# Patient Record
Sex: Female | Born: 1945 | Race: Black or African American | Hispanic: No | Marital: Married | Smoking: Never smoker
Health system: Southern US, Community
[De-identification: ages and names within clinical notes are randomized; demographics above are authoritative.]

## PROBLEM LIST (undated history)

## (undated) DIAGNOSIS — G5600 Carpal tunnel syndrome, unspecified upper limb: Secondary | ICD-10-CM

## (undated) DIAGNOSIS — I10 Essential (primary) hypertension: Secondary | ICD-10-CM

## (undated) DIAGNOSIS — E669 Obesity, unspecified: Secondary | ICD-10-CM

## (undated) DIAGNOSIS — M1990 Unspecified osteoarthritis, unspecified site: Secondary | ICD-10-CM

## (undated) DIAGNOSIS — K219 Gastro-esophageal reflux disease without esophagitis: Secondary | ICD-10-CM

## (undated) DIAGNOSIS — E049 Nontoxic goiter, unspecified: Secondary | ICD-10-CM

## (undated) HISTORY — DX: Unspecified osteoarthritis, unspecified site: M19.90

## (undated) HISTORY — DX: Essential (primary) hypertension: I10

## (undated) HISTORY — PX: CHOLECYSTECTOMY: SHX55

## (undated) HISTORY — PX: ABDOMINAL HYSTERECTOMY: SHX81

## (undated) HISTORY — PX: TUBAL LIGATION: SHX77

## (undated) HISTORY — DX: Obesity, unspecified: E66.9

## (undated) HISTORY — DX: Carpal tunnel syndrome, unspecified upper limb: G56.00

## (undated) HISTORY — PX: TOTAL HIP ARTHROPLASTY: SHX124

## (undated) HISTORY — DX: Gastro-esophageal reflux disease without esophagitis: K21.9

## (undated) HISTORY — PX: BREAST REDUCTION SURGERY: SHX8

## (undated) HISTORY — DX: Nontoxic goiter, unspecified: E04.9

## (undated) HISTORY — PX: REDUCTION MAMMAPLASTY: SUR839

## (undated) HISTORY — PX: DILATION AND CURETTAGE OF UTERUS: SHX78

## (undated) HISTORY — PX: BREAST SURGERY: SHX581

## (undated) HISTORY — PX: JOINT REPLACEMENT: SHX530

---

## 2002-06-21 ENCOUNTER — Encounter: Admission: RE | Admit: 2002-06-21 | Discharge: 2002-06-21 | Payer: Self-pay | Admitting: Internal Medicine

## 2002-06-21 ENCOUNTER — Encounter: Payer: Self-pay | Admitting: Internal Medicine

## 2003-10-23 ENCOUNTER — Encounter: Admission: RE | Admit: 2003-10-23 | Discharge: 2003-10-23 | Payer: Self-pay | Admitting: Internal Medicine

## 2003-11-24 ENCOUNTER — Encounter: Admission: RE | Admit: 2003-11-24 | Discharge: 2003-11-24 | Payer: Self-pay | Admitting: Orthopedic Surgery

## 2004-06-14 ENCOUNTER — Encounter: Admission: RE | Admit: 2004-06-14 | Discharge: 2004-06-14 | Payer: Self-pay | Admitting: Internal Medicine

## 2004-10-19 ENCOUNTER — Ambulatory Visit: Payer: Self-pay | Admitting: Physical Medicine & Rehabilitation

## 2004-10-19 ENCOUNTER — Inpatient Hospital Stay (HOSPITAL_COMMUNITY): Admission: RE | Admit: 2004-10-19 | Discharge: 2004-10-25 | Payer: Self-pay | Admitting: Orthopedic Surgery

## 2007-01-12 ENCOUNTER — Encounter: Admission: RE | Admit: 2007-01-12 | Discharge: 2007-01-12 | Payer: Self-pay | Admitting: Internal Medicine

## 2008-11-19 ENCOUNTER — Encounter: Admission: RE | Admit: 2008-11-19 | Discharge: 2008-11-19 | Payer: Self-pay | Admitting: Orthopedic Surgery

## 2008-12-05 ENCOUNTER — Ambulatory Visit (HOSPITAL_COMMUNITY): Admission: RE | Admit: 2008-12-05 | Discharge: 2008-12-05 | Payer: Self-pay | Admitting: Orthopedic Surgery

## 2008-12-23 ENCOUNTER — Encounter: Admission: RE | Admit: 2008-12-23 | Discharge: 2008-12-23 | Payer: Self-pay | Admitting: Orthopedic Surgery

## 2009-02-23 ENCOUNTER — Inpatient Hospital Stay (HOSPITAL_COMMUNITY): Admission: RE | Admit: 2009-02-23 | Discharge: 2009-02-27 | Payer: Self-pay | Admitting: Orthopedic Surgery

## 2009-08-07 ENCOUNTER — Encounter: Admission: RE | Admit: 2009-08-07 | Discharge: 2009-08-07 | Payer: Self-pay | Admitting: Internal Medicine

## 2010-04-18 LAB — BASIC METABOLIC PANEL
BUN: 10 mg/dL (ref 6–23)
BUN: 11 mg/dL (ref 6–23)
BUN: 12 mg/dL (ref 6–23)
CO2: 28 mEq/L (ref 19–32)
CO2: 30 mEq/L (ref 19–32)
Calcium: 8.2 mg/dL — ABNORMAL LOW (ref 8.4–10.5)
Calcium: 8.5 mg/dL (ref 8.4–10.5)
Calcium: 9.9 mg/dL (ref 8.4–10.5)
Chloride: 100 mEq/L (ref 96–112)
Chloride: 102 mEq/L (ref 96–112)
Creatinine, Ser: 0.74 mg/dL (ref 0.4–1.2)
Creatinine, Ser: 0.75 mg/dL (ref 0.4–1.2)
Creatinine, Ser: 0.98 mg/dL (ref 0.4–1.2)
GFR calc Af Amer: >60 mL/min (ref >60)
GFR calc Af Amer: >60 mL/min (ref >60)
GFR calc non Af Amer: 58 mL/min — ABNORMAL LOW (ref >60)
GFR calc non Af Amer: >60 mL/min (ref >60)
Glucose, Bld: 116 mg/dL — ABNORMAL HIGH (ref 70–99)
Glucose, Bld: 91 mg/dL (ref 70–99)
Potassium: 3.8 mEq/L (ref 3.5–5.1)
Potassium: 3.8 mEq/L (ref 3.5–5.1)
Sodium: 139 mEq/L (ref 135–145)

## 2010-04-18 LAB — URINALYSIS, ROUTINE W REFLEX MICROSCOPIC
Bilirubin Urine: NEGATIVE
Glucose, UA: NEGATIVE mg/dL
Hgb urine dipstick: NEGATIVE
Ketones, ur: NEGATIVE mg/dL
Nitrite: NEGATIVE
Protein, ur: NEGATIVE mg/dL
Specific Gravity, Urine: 1.022 (ref 1.005–1.030)
Urobilinogen, UA: 1 mg/dL (ref 0.0–1.0)
pH: 6.5 (ref 5.0–8.0)

## 2010-04-18 LAB — URINE MICROSCOPIC-ADD ON

## 2010-04-18 LAB — PROTIME-INR
INR: 1.05 (ref 0.00–1.49)
INR: 1.06 (ref 0.00–1.49)
INR: 1.25 (ref 0.00–1.49)
INR: 1.54 — ABNORMAL HIGH (ref 0.00–1.49)
INR: 1.72 — ABNORMAL HIGH (ref 0.00–1.49)
Prothrombin Time: 13.6 seconds (ref 11.6–15.2)
Prothrombin Time: 13.7 seconds (ref 11.6–15.2)
Prothrombin Time: 15.6 seconds — ABNORMAL HIGH (ref 11.6–15.2)
Prothrombin Time: 18.4 seconds — ABNORMAL HIGH (ref 11.6–15.2)
Prothrombin Time: 20 seconds — ABNORMAL HIGH (ref 11.6–15.2)

## 2010-04-18 LAB — GRAM STAIN

## 2010-04-18 LAB — CBC
HCT: 36.8 % (ref 36.0–46.0)
Hemoglobin: 12.1 g/dL (ref 12.0–15.0)
MCHC: 32.8 g/dL (ref 30.0–36.0)
MCHC: 33.7 g/dL (ref 30.0–36.0)
MCV: 83.1 fL (ref 78.0–100.0)
MCV: 83.2 fL (ref 78.0–100.0)
MCV: 83.4 fL (ref 78.0–100.0)
Platelets: 228 10*3/uL (ref 150–400)
Platelets: 250 10*3/uL (ref 150–400)
Platelets: 261 10*3/uL (ref 150–400)
Platelets: 279 10*3/uL (ref 150–400)
RBC: 3.52 MIL/uL — ABNORMAL LOW (ref 3.87–5.11)
RBC: 3.52 MIL/uL — ABNORMAL LOW (ref 3.87–5.11)
RBC: 4.42 MIL/uL (ref 3.87–5.11)
RDW: 15.3 % (ref 11.5–15.5)
RDW: 15.3 % (ref 11.5–15.5)
WBC: 6.4 10*3/uL (ref 4.0–10.5)
WBC: 9.1 10*3/uL (ref 4.0–10.5)
WBC: 9.6 10*3/uL (ref 4.0–10.5)

## 2010-04-18 LAB — TYPE AND SCREEN
ABO/RH(D): AB POS
Antibody Screen: NEGATIVE

## 2010-04-18 LAB — URINE CULTURE: Colony Count: 50000

## 2010-04-18 LAB — BODY FLUID CULTURE: Culture: NO GROWTH

## 2010-04-18 LAB — TISSUE CULTURE

## 2010-04-18 LAB — APTT: aPTT: 28 seconds (ref 24–37)

## 2010-04-18 LAB — ANAEROBIC CULTURE

## 2010-06-18 NOTE — Discharge Summary (Signed)
NAME:  [name], [name] NO.:  [id number]   MEDICAL RECORD NO.:  [id number]          PATIENT TYPE:  INP   LOCATION:  5025                         FACILITY:  [facility]   PHYSICIAN:  Burnard Bunting, M.D.    DATE OF BIRTH:  1945/12/01   DATE OF ADMISSION:  10/19/2004  DATE OF DISCHARGE:  10/25/2004                                 DISCHARGE SUMMARY   DISCHARGE DIAGNOSIS:  Right hip arthritis.   SECONDARY DIAGNOSIS:  None.   OPERATION/PROCEDURE:  Right total hip arthroplasty.   HOSPITAL COURSE:  Diana Thornton is a 65 year old patient with right  hip arthritis. She underwent right total hip arthroplasty on October 20, 2004. She tolerated the procedure well without any complications. She was  started on Coumadin postoperatively for DVT prophylaxis. Leg lengths were  approximately equal and dorsiflexion was intact. On postoperative day #1,  hemoglobin was 10.5 at that time. She was immobilized on physical therapy,  weight bearing as tolerated. She had an otherwise unremarkable recovery. She  was mobilizing well in the hallway at that time of discharge. She was  instructed in hip precautions. She was discharged in good condition.   DISCHARGE MEDICATIONS:  Lotrel, potassium, hydrochlorothiazide, fluoxetine,  Premarin, AcipHex, Altace, as well as Coumadin and Percocet for deep venous  thrombosis prophylaxis and Robaxin. She will follow up with me in one week  for suture removal.           ______________________________  G. Dorene Grebe, M.D.     GSD/MEDQ  D:  01/06/2005  T:  01/06/2005  Job:  [id number]

## 2010-06-18 NOTE — Op Note (Signed)
NAME:  Diana Thornton, Diana Thornton NO.:  [id number]   MEDICAL RECORD NO.:  [id number]          PATIENT TYPE:  INP   LOCATION:  5025                         FACILITY:  [facility]   PHYSICIAN:  Burnard Bunting, M.D.    DATE OF BIRTH:  [birth year]   DATE OF PROCEDURE:  10/19/2004  DATE OF DISCHARGE:                                 OPERATIVE REPORT   PREOPERATIVE DIAGNOSIS:  Right hip arthritis.   POSTOPERATIVE DIAGNOSIS:  Right hip arthritis.   PROCEDURE:  Right total hip replacement.   SURGEON:  Burnard Bunting, M.D.   ASSISTANT:   ANESTHESIA:  General endotracheal anesthesia.   ESTIMATED BLOOD LOSS:  400 mL.   DRAINS:  None.   COMPONENTS:  S-ROM ASR cup 48, femoral stem 20 by 15 38 standard neck plus a  lateral proximal sleeve 20 B small sleeve adapter plus 0, head size 43.   PROCEDURE IN DETAIL:  The patient was brought to the operating room where  general endotracheal anesthesia was induced.  Preoperative IV antibiotics  including Ancef and gentamicin were administered.  The patient was placed in  the lateral decubitus position with the left axillary nerve and left  peroneal nerve well padded.  The right foot, leg, and hip were prepped with  DuraPrep solution and draped in a sterile manner.  Diana Thornton was used to cover  the operative field.  A posterior approach to the hip was utilized.  The  skin and subcutaneous tissue were sharply divided.  The fascia lata was  encountered and divided.  The sciatic nerve was identified at this time and  protected during the remaining portion of the case.  A hip retractor was  placed.  The piriformis tendon was identified, tagged, and the external  rotators were detached from the capsule.  Bleed points encountered were  controlled using electrocautery.  The capsule was split in a T-shaped manner  with #1 Ethibond marking the edges.  The labrum was excised.  A trial  component was constructed on the back table.  This trial component was used  to construct the femoral neck cut.  The femoral neck was then cut  approximately a fingerbreadth above the lesser trochanter.  Distal reaming  was then performed after lateralization.  Reaming up to 15.5 mm was  performed with good bony contact.  At this time, proximal reaming and  milling was performed.  The sleeve and trial implant were then placed.  At  this time, following femoral preparation, the acetabulum was reamed at  approximately 45 degrees of abduction and 45 degrees of anteversion.  The  cup was reamed to 48 mm and excess removed.  The 28 mm cup was then tapped  into position and well seated.  At this time, a trial reduction was  performed with components seated in approximately 10 degrees of retroversion  based on the calcar, with a plus 0 neck, the hip had a good range of motion  and stability to external rotation and extension, position of the sleeve, as  well as 90 degrees hip flexion, 15 degrees of abduction, and 70 degrees of  internal rotation.  At this time, interoperative x-ray demonstrated  approximate equal leg lengths and good fill of the canal and position of the  components.  At this time, the femoral components were removed and the true  sleeve was placed.  The component was then placed in 10 degrees of  anteversion relative to the sleeve and good fit was obtained.  The hip was  again reduced with a +0 sleeve and found to have good stability in extension  and position of the sleeve and hip placed with internal rotation.  At this  time, the incision was thoroughly irrigated.  The sciatic nerve was again  palpated and found to be intact.  The capsule was repaired using #1 Vicryl  suture.  The piriformis tendon was tagged back to the capsule using #1  Vicryl suture.  The fascia lata was closed using interrupted inverted #1  Vicryl suture, the subcutaneous tissues were closed in layers using 0 Vicryl  and 2-0 Vicryl followed by skin staples.  The patient tolerated  the  procedure well without immediate complications.  Leg lengths were  approximately equal at the conclusion of the case.  A knee immobilizer was  placed.           ______________________________  Diana Thornton, M.D.     GSD/MEDQ  D:  10/19/2004  T:  10/19/2004  Job:  [id number]

## 2010-11-12 ENCOUNTER — Emergency Department (HOSPITAL_COMMUNITY): Payer: 59

## 2010-11-12 ENCOUNTER — Emergency Department (HOSPITAL_COMMUNITY)
Admission: EM | Admit: 2010-11-12 | Discharge: 2010-11-13 | Disposition: A | Discharge status: Home / Self Care | Payer: 59 | Attending: Emergency Medicine | Admitting: Emergency Medicine

## 2010-11-12 DIAGNOSIS — M129 Arthropathy, unspecified: Secondary | ICD-10-CM | POA: Insufficient documentation

## 2010-11-12 DIAGNOSIS — I10 Essential (primary) hypertension: Secondary | ICD-10-CM | POA: Insufficient documentation

## 2010-11-12 DIAGNOSIS — R079 Chest pain, unspecified: Secondary | ICD-10-CM | POA: Insufficient documentation

## 2010-11-12 LAB — BASIC METABOLIC PANEL
BUN: 19 mg/dL (ref 6–23)
Calcium: 10 mg/dL (ref 8.4–10.5)
GFR calc Af Amer: >90 mL/min (ref >90)
GFR calc non Af Amer: >90 mL/min (ref >90)
Glucose, Bld: 114 mg/dL — ABNORMAL HIGH (ref 70–99)
Sodium: 139 mEq/L (ref 135–145)

## 2010-11-12 LAB — CBC
HCT: 39.2 % (ref 36.0–46.0)
Hemoglobin: 12.5 g/dL (ref 12.0–15.0)
MCH: 26.6 pg (ref 26.0–34.0)
MCHC: 31.9 g/dL (ref 30.0–36.0)
RDW: 14.3 % (ref 11.5–15.5)

## 2010-11-12 LAB — POCT I-STAT TROPONIN I: Troponin i, poc: 0 ng/mL (ref 0.00–0.08)

## 2010-11-12 LAB — DIFFERENTIAL
Basophils Relative: 0 % (ref 0–1)
Eosinophils Relative: 2 % (ref 0–5)
Monocytes Absolute: 0.5 10*3/uL (ref 0.1–1.0)
Monocytes Relative: 7 % (ref 3–12)
Neutro Abs: 2.7 10*3/uL (ref 1.7–7.7)

## 2010-11-15 ENCOUNTER — Emergency Department (HOSPITAL_COMMUNITY)
Admission: EM | Admit: 2010-11-15 | Discharge: 2010-11-15 | Disposition: A | Discharge status: Home / Self Care | Payer: 59 | Attending: Emergency Medicine | Admitting: Emergency Medicine

## 2010-11-15 DIAGNOSIS — I10 Essential (primary) hypertension: Secondary | ICD-10-CM | POA: Insufficient documentation

## 2010-11-15 DIAGNOSIS — Z0000 Encounter for general adult medical examination without abnormal findings: Secondary | ICD-10-CM | POA: Insufficient documentation

## 2010-11-15 DIAGNOSIS — Z79899 Other long term (current) drug therapy: Secondary | ICD-10-CM | POA: Insufficient documentation

## 2010-11-17 ENCOUNTER — Encounter: Payer: Self-pay | Admitting: *Deleted

## 2010-11-17 ENCOUNTER — Encounter: Payer: Self-pay | Admitting: Cardiovascular Disease

## 2010-11-18 ENCOUNTER — Encounter: Payer: Self-pay | Admitting: Cardiovascular Disease

## 2010-11-18 ENCOUNTER — Ambulatory Visit (INDEPENDENT_AMBULATORY_CARE_PROVIDER_SITE_OTHER): Payer: 59 | Admitting: Cardiovascular Disease

## 2010-11-18 DIAGNOSIS — I517 Cardiomegaly: Secondary | ICD-10-CM

## 2010-11-18 DIAGNOSIS — I10 Essential (primary) hypertension: Secondary | ICD-10-CM | POA: Insufficient documentation

## 2010-11-18 DIAGNOSIS — R0602 Shortness of breath: Secondary | ICD-10-CM

## 2010-11-18 DIAGNOSIS — R079 Chest pain, unspecified: Secondary | ICD-10-CM | POA: Insufficient documentation

## 2010-11-18 NOTE — Assessment & Plan Note (Signed)
ETT and CT to R/O PE and get better look at aorta.

## 2010-11-18 NOTE — Progress Notes (Signed)
65 yo seen in urgent care 10/12 and 10/15 for SSCP.  Pain two weeks ago and then recured.  R/O ECG normal reviewed both ER visit notes.  Pain is sharp and radiates to back  Significant dyspnea with it.  CXR ? Cardiomegaly.  BP meds for years.  Did not take them today and may not be ideally controlled.  She works full time at [employer] in [place] and has no problems.  Activity limited by right hip has been replaced twice.   ROS: Denies fever, malais, weight loss, blurry vision, decreased visual acuity, cough, sputum, SOB, hemoptysis, pleuritic pain, palpitaitons, heartburn, abdominal pain, melena, lower extremity edema, claudication, or rash.  All other systems reviewed and negative   General: Affect appropriate Healthy:  appears stated age HEENT: normal Neck supple with no adenopathy JVP normal no bruits no thyromegaly Lungs clear with no wheezing and good diaphragmatic motion Heart:  S1/S2 no murmur,rub, gallop or click PMI normal Abdomen: benighn, BS positve, no tenderness, no AAA no bruit.  No HSM or HJR Distal pulses intact with no bruits No edema Neuro non-focal Skin warm and dry No muscular weakness  Medications Current Outpatient Prescriptions  Medication Sig Dispense Refill  . hydrochlorothiazide (HYDRODIURIL) 50 MG tablet Take 50 mg by mouth daily.        . meloxicam (MOBIC) 15 MG tablet Take 15 mg by mouth as needed.       Marland Kitchen olmesartan (BENICAR) 40 MG tablet Take 40 mg by mouth daily.        . verapamil (CALAN-SR) 180 MG CR tablet Take 180 mg by mouth at bedtime.          Allergies Morphine and related  Family History: No family history on file.  Social History: History   Social History  . Marital Status: Married    Spouse Name: N/A    Number of Children: N/A  . Years of Education: N/A   Occupational History  . Not on file.   Social History Main Topics  . Smoking status: Never Smoker   . Smokeless tobacco: Not on file  . Alcohol Use: Not on file  . Drug Use:  Not on file  . Sexually Active: Not on file   Other Topics Concern  . Not on file   Social History Narrative  . No narrative on file    Electrocardiogram:  10//12 10/15 ER reviewed NSR Normal ECG  Assessment and Plan

## 2010-11-18 NOTE — Assessment & Plan Note (Signed)
Continue current meds with compliance stressed.  See what exercise BP is

## 2010-11-18 NOTE — Patient Instructions (Addendum)
Your physician recommends that you schedule a follow-up appointment in: AS NEEDED Your physician has requested that you have an echocardiogram. Echocardiography is a painless test that uses sound waves to create images of your heart. It provides your doctor with information about the size and shape of your heart and how well your heart's chambers and valves are working. This procedure takes approximately one hour. There are no restrictions for this procedure. DX SHORTNESS OF BREATH Your physician has requested that you have an exercise tolerance test. For further information please visit [email or web address] Please also follow instruction sheet, as given. DX CHEST PAIN Non-Cardiac CT scanning, (CAT scanning), is a noninvasive, special x-ray that produces cross-sectional images of the body using x-rays and a computer. CT scans help physicians diagnose and treat medical conditions. For some CT exams, a contrast material is used to enhance visibility in the area of the body being studied. CT scans provide greater clarity and reveal more details than regular x-ray exams. R/O PE

## 2010-11-18 NOTE — Assessment & Plan Note (Signed)
With dyspnea  Echo to assess RV/LV function

## 2010-11-19 ENCOUNTER — Ambulatory Visit (INDEPENDENT_AMBULATORY_CARE_PROVIDER_SITE_OTHER)
Admission: RE | Admit: 2010-11-19 | Discharge: 2010-11-19 | Disposition: A | Discharge status: Home / Self Care | Payer: 59 | Source: Ambulatory Visit | Attending: Cardiovascular Disease | Admitting: Cardiovascular Disease

## 2010-11-19 DIAGNOSIS — R079 Chest pain, unspecified: Secondary | ICD-10-CM

## 2010-11-19 DIAGNOSIS — R0602 Shortness of breath: Secondary | ICD-10-CM

## 2010-11-19 MED ORDER — IOHEXOL 350 MG/ML SOLN: 100.0000 mL | Freq: Once | INTRAVENOUS | Status: AC | PRN

## 2010-11-25 ENCOUNTER — Encounter: Payer: Self-pay | Admitting: *Deleted

## 2010-12-02 ENCOUNTER — Ambulatory Visit (HOSPITAL_COMMUNITY): Payer: 59 | Attending: Cardiology | Admitting: Radiology

## 2010-12-02 ENCOUNTER — Encounter (INDEPENDENT_AMBULATORY_CARE_PROVIDER_SITE_OTHER): Payer: 59

## 2010-12-02 ENCOUNTER — Ambulatory Visit (INDEPENDENT_AMBULATORY_CARE_PROVIDER_SITE_OTHER): Payer: 59 | Admitting: Physician Assistant

## 2010-12-02 DIAGNOSIS — R079 Chest pain, unspecified: Secondary | ICD-10-CM

## 2010-12-02 DIAGNOSIS — R9431 Abnormal electrocardiogram [ECG] [EKG]: Secondary | ICD-10-CM

## 2010-12-02 DIAGNOSIS — I10 Essential (primary) hypertension: Secondary | ICD-10-CM | POA: Insufficient documentation

## 2010-12-02 DIAGNOSIS — I517 Cardiomegaly: Secondary | ICD-10-CM

## 2010-12-02 DIAGNOSIS — R0989 Other specified symptoms and signs involving the circulatory and respiratory systems: Secondary | ICD-10-CM | POA: Insufficient documentation

## 2010-12-02 DIAGNOSIS — I079 Rheumatic tricuspid valve disease, unspecified: Secondary | ICD-10-CM | POA: Insufficient documentation

## 2010-12-02 DIAGNOSIS — E669 Obesity, unspecified: Secondary | ICD-10-CM | POA: Insufficient documentation

## 2010-12-02 DIAGNOSIS — R9439 Abnormal result of other cardiovascular function study: Secondary | ICD-10-CM

## 2010-12-02 DIAGNOSIS — R0609 Other forms of dyspnea: Secondary | ICD-10-CM | POA: Insufficient documentation

## 2010-12-02 NOTE — Patient Instructions (Signed)
Your physician recommends that you schedule a follow-up appointment in: 2-3 WEEKS WITH DR. Eden Emms PER Tereso Newcomer, PA-C  Your physician has requested that you have a lexiscan myoview DX ABNORMAL STRESS TEST, CHEST PAIN. For further information please visit [email or web address] Please follow instruction sheet, as given.   Your physician has recommended that you wear a 48 HOUR holter monitor DX ABNORMAL STRESS TEST, CHEST PAIN. Holter monitors are medical devices that record the heart's electrical activity. Doctors most often use these monitors to diagnose arrhythmias. Arrhythmias are problems with the speed or rhythm of the heartbeat. The monitor is a small, portable device. You can wear one while you do your normal daily activities. This is usually used to diagnose what is causing palpitations/syncope (passing out).

## 2010-12-02 NOTE — Progress Notes (Signed)
Exercise Treadmill Test  Pre-Exercise Testing Evaluation Rhythm: normal sinus  Rate: 66   PR:  .18 QRS:  .09  QT:  .39 QTc: .41     Test  Exercise Tolerance Test Ordering MD: Charlton Haws, MD  Interpreting MD:  Tereso Newcomer PA-C  Unique Test No:1    Treadmill:  1  Indication for ETT: chest pain - rule out ischemia  Contraindication to ETT: No   Stress Modality: exercise - treadmill  Cardiac Imaging Performed: non   Protocol: standard Bruce - maximal  Max BP: 230/70  Max MPHR (bpm):  157 85% MPR (bpm):  133  MPHR obtained (bpm): 134 % MPHR obtained:  85%  Reached 85% MPHR (min:sec):  3:00 Total Exercise Time (min-sec):  3:14  Workload in METS: 4.8 Borg Scale: 15  Reason ETT Terminated:  patient's desire to stop    ST Segment Analysis At Rest: normal ST segments - no evidence of significant ST depression With Exercise: significant ischemic ST depression  Other Information Arrhythmia:  Frequent PACs, rare PVC Angina during ETT:  absent (0) Quality of ETT:  diagnostic  ETT Interpretation:  abnormal - evidence of ST depression consistent with ischemia  Comments: Poor exercise tolerance. No chest pain. Patient was dyspneic.  Hypertensive BP response to exercise. There was inferolateral ST segment depression on stress ECGs.  Some segments were downsloping.   Recommendations: Discussed with Dr. Eden Emms who reviewed ECGs.  Will arrange Lexiscan Myoview to further assess for ischemia. Patient also noting lots of palpitations.  She had a lot of PACs during her test. Will arrange 48 Holter and follow up with Dr. Eden Emms. Tereso Newcomer, PA-C

## 2010-12-08 ENCOUNTER — Ambulatory Visit (HOSPITAL_COMMUNITY): Payer: 59 | Attending: Cardiovascular Disease | Admitting: Radiology

## 2010-12-08 DIAGNOSIS — R0602 Shortness of breath: Secondary | ICD-10-CM

## 2010-12-08 DIAGNOSIS — R079 Chest pain, unspecified: Secondary | ICD-10-CM | POA: Insufficient documentation

## 2010-12-08 DIAGNOSIS — R9431 Abnormal electrocardiogram [ECG] [EKG]: Secondary | ICD-10-CM | POA: Insufficient documentation

## 2010-12-08 DIAGNOSIS — R9439 Abnormal result of other cardiovascular function study: Secondary | ICD-10-CM

## 2010-12-08 DIAGNOSIS — I10 Essential (primary) hypertension: Secondary | ICD-10-CM | POA: Insufficient documentation

## 2010-12-08 MED ORDER — TECHNETIUM TC 99M TETROFOSMIN IV KIT
11.0000 | PACK | Freq: Once | INTRAVENOUS | Status: AC | PRN
  Administered 2010-12-08: 11 via INTRAVENOUS

## 2010-12-08 MED ORDER — TECHNETIUM TC 99M TETROFOSMIN IV KIT
33.0000 | PACK | Freq: Once | INTRAVENOUS | Status: AC | PRN
  Administered 2010-12-08: 33 via INTRAVENOUS

## 2010-12-08 MED ORDER — REGADENOSON 0.4 MG/5ML IV SOLN
0.4000 mg | Freq: Once | INTRAVENOUS | Status: AC
  Administered 2010-12-08: 0.4 mg via INTRAVENOUS

## 2010-12-08 NOTE — Progress Notes (Signed)
[facility] SITE 3 NUCLEAR MED [address] [place] [address] [phone]  Cardiology Nuclear Med Study  Diana Thornton is a 65 y.o. female [id number] March 23, 1945   Nuclear Med Background Indication for Stress Test:  Evaluation for Ischemia,11/12/10 Post Hospital: Chest pain, (-) enzymes, Abnormal EKG and  12/02/10 Abnormal GXT: ST changes (inferolateral) History: 11/18/10 (CT)/MRI: Goiter/mild heart enlargement, 11/01.12 Echo: EF 65-70% and 12/02/10 GXT: ST depression with ischemia Cardiac Risk Factors: Hypertension  Symptoms:  Chest Pain   Nuclear Pre-Procedure Caffeine/Decaff Intake:  None NPO After: 5:30pm   Lungs:  clear IV 0.9% NS with Angio Cath:  20g  IV Site: L Antecubital x 1, tolerated well IV Started by:  Irean Hong, RN  Chest Size (in):  40 Cup Size: D  Height: 5\' 2"  (1.575 m)  Weight:  208 lb (94.348 kg)  BMI:  Body mass index is 38.04 kg/(m^2). Tech Comments:  N/A    Nuclear Med Study 1 or 2 day study: 1 day  Stress Test Type:  Lexiscan  Reading MD: Charlton Haws, MD  Order Authorizing Provider:  Charlton Haws, MD,Scott Alben Spittle, [facility]  Resting Radionuclide: Technetium 64m Tetrofosmin  Resting Radionuclide Dose: 11.0 mCi   Stress Radionuclide:  Technetium 74m Tetrofosmin  Stress Radionuclide Dose: 33.0 mCi           Stress Protocol Rest HR: 58 Stress HR: 111  Rest BP: 119/66 Stress BP: 156/53  Exercise Time (min): n/a METS: n/a   Predicted Max HR: 157 bpm % Max HR: 70.7 bpm Rate Pressure Product: 21308   Dose of Adenosine (mg):  n/a Dose of Lexiscan: 0.4 mg  Dose of Atropine (mg): n/a Dose of Dobutamine: n/a mcg/kg/min (at max HR)  Stress Test Technologist: Milana Na, EMT-P  Nuclear Technologist:  Doyne Keel, CNMT     Rest Procedure:  Myocardial perfusion imaging was performed at rest 45 minutes following the intravenous administration of Technetium 47m Tetrofosmin. Rest ECG: NSR  Stress Procedure:  The patient  received IV Lexiscan 0.4 mg over 15-seconds.  Technetium 7m Tetrofosmin injected at 30-seconds.  There were non specific changes, sob, woozy, dizziness, abdominal pain, and a rare pac with Lexiscan.  Quantitative spect images were obtained after a 45 minute delay. Stress ECG: No significant change from baseline ECG  QPS Raw Data Images:  Normal; no motion artifact; normal heart/lung ratio. Stress Images:  Normal homogeneous uptake in all areas of the myocardium. Rest Images:  Normal homogeneous uptake in all areas of the myocardium. Subtraction (SDS):  Normal Transient Ischemic Dilatation (Normal <1.22):  0.99 Lung/Heart Ratio (Normal <0.45):  0.27  Quantitative Gated Spect Images QGS EDV:  78 ml QGS ESV:  21 ml QGS cine images:  NL LV Function; NL Wall Motion QGS EF: 74%  Impression Exercise Capacity:  Lexiscan with no exercise. BP Response:  Normal blood pressure response. Clinical Symptoms:  No chest pain. ECG Impression:  No significant ST segment change suggestive of ischemia. Comparison with Prior Nuclear Study: No images to compare  Overall Impression:  Normal stress nuclear study.   Charlton Haws

## 2010-12-10 ENCOUNTER — Telehealth: Payer: Self-pay | Admitting: *Deleted

## 2010-12-10 NOTE — Telephone Encounter (Signed)
Spoke with pt, made aware monitor reviewed by dr Eden Emms shows sinus, PAC's with short bursts of SVT (4 beats) pt has an appt 12-24-10 she will make sure to keep that appt Diana Thornton

## 2010-12-17 ENCOUNTER — Telehealth: Payer: Self-pay | Admitting: Cardiovascular Disease

## 2010-12-17 MED ORDER — NEBIVOLOL HCL 10 MG PO TABS: 10.0000 mg | ORAL_TABLET | Freq: Every day | ORAL | Status: DC

## 2010-12-17 NOTE — Telephone Encounter (Signed)
PT AWARE OF MONITOR RESULTS  NEW MED SENT TO [facility]IA [facility] .Zack Seal

## 2010-12-17 NOTE — Telephone Encounter (Signed)
Pl ease call patient at [phone] and leave a message.

## 2010-12-24 ENCOUNTER — Ambulatory Visit: Payer: 59 | Admitting: Cardiovascular Disease

## 2011-01-20 ENCOUNTER — Ambulatory Visit (INDEPENDENT_AMBULATORY_CARE_PROVIDER_SITE_OTHER): Payer: 59 | Admitting: Cardiovascular Disease

## 2011-01-20 ENCOUNTER — Encounter: Payer: Self-pay | Admitting: *Deleted

## 2011-01-20 VITALS — BP 133/75 | HR 86 | Wt 212.0 lb

## 2011-01-20 DIAGNOSIS — R002 Palpitations: Secondary | ICD-10-CM

## 2011-01-20 DIAGNOSIS — I517 Cardiomegaly: Secondary | ICD-10-CM

## 2011-01-20 MED ORDER — ATENOLOL 50 MG PO TABS: 50.0000 mg | ORAL_TABLET | Freq: Every day | ORAL | Status: DC

## 2011-01-20 NOTE — Patient Instructions (Signed)
Your physician recommends that you schedule a follow-up appointment in: 3 months with Dr Eden Emms  Your physician has recommended you make the following change in your medication: stop BYSTOLIC  START ATENOLOL 50 MG 1 TAB  EVERY DAY  Your physician recommends that you return for lab work in: LAB TODAY  TSH FREE T4 DX 785.1

## 2011-01-20 NOTE — Assessment & Plan Note (Signed)
Benign.  Nothing serious on event monitor  Adjust beta blocker F/U in 3 months.  Check TSH/Free T4

## 2011-01-20 NOTE — Progress Notes (Signed)
65 yo seen in urgent care 10/12 and 10/15 for SSCP. Pain two weeks ago and then recured. R/O ECG normal reviewed both ER visit notes. Pain is sharp and radiates to back Significant dyspnea with it. CXR ? Cardiomegaly. BP meds for years. Did not take them today and may not be ideally controlled. She works full time at [employer] in [place] and has no problems. Activity limited by right hip has been replaced twice.  Now has complaints of jitteryness, voice changes.  Bystolic seems to excite her.    Reviewed monitor and only PAC;s 4 bt run SVT.   Reviewed nuclear 11/12 normal with no ischemia Reviewed echo 11/1  EF 65-70% no valve abnormalities  ROS: Denies fever, malais, weight loss, blurry vision, decreased visual acuity, cough, sputum, SOB, hemoptysis, pleuritic pain, palpitaitons, heartburn, abdominal pain, melena, lower extremity edema, claudication, or rash.  All other systems reviewed and negative  General: Affect appropriate Healthy:  appears stated age HEENT: normal Neck supple with no adenopathy JVP normal no bruits no thyromegaly Lungs clear with no wheezing and good diaphragmatic motion Heart:  S1/S2 soft SEM murmur,rub, gallop or click PMI normal Abdomen: benighn, BS positve, no tenderness, no AAA no bruit.  No HSM or HJR Distal pulses intact with no bruits No edema Neuro non-focal Skin warm and dry No muscular weakness   Current Outpatient Prescriptions  Medication Sig Dispense Refill  . hydrochlorothiazide (HYDRODIURIL) 50 MG tablet Take 50 mg by mouth daily.        . meloxicam (MOBIC) 15 MG tablet Take 15 mg by mouth as needed.       . nebivolol (BYSTOLIC) 10 MG tablet Take 10 mg by mouth as needed.        Marland Kitchen olmesartan (BENICAR) 40 MG tablet Take 40 mg by mouth daily.          Allergies  Morphine and related  Electrocardiogram:  Assessment and Plan

## 2011-01-20 NOTE — Assessment & Plan Note (Signed)
Atyhpical  Normal myovue observe

## 2011-01-20 NOTE — Progress Notes (Signed)
Addended by: Alma Friendly on: 01/20/2011 09:25 AM   Modules accepted: Orders

## 2011-01-20 NOTE — Assessment & Plan Note (Signed)
Normal LV size on echo and normal EF by echo and nuclear.  Rx BP

## 2011-01-20 NOTE — Assessment & Plan Note (Signed)
Well controlled.  Continue current medications and low sodium Dash type diet.   Change beta blocker to atenolol

## 2011-04-22 ENCOUNTER — Ambulatory Visit: Payer: 59 | Admitting: Cardiovascular Disease

## 2011-09-07 ENCOUNTER — Emergency Department (HOSPITAL_COMMUNITY)
Admission: EM | Admit: 2011-09-07 | Discharge: 2011-09-07 | Disposition: A | Discharge status: Home / Self Care | Payer: 59 | Attending: Emergency Medicine | Admitting: Emergency Medicine

## 2011-09-07 ENCOUNTER — Emergency Department (HOSPITAL_COMMUNITY): Payer: 59

## 2011-09-07 ENCOUNTER — Ambulatory Visit (INDEPENDENT_AMBULATORY_CARE_PROVIDER_SITE_OTHER): Payer: 59 | Admitting: Family Medicine

## 2011-09-07 ENCOUNTER — Encounter (HOSPITAL_COMMUNITY): Payer: Self-pay | Admitting: Emergency Medicine

## 2011-09-07 VITALS — BP 147/84 | HR 79 | Temp 97.9°F | Resp 18 | Ht 62.5 in | Wt 221.0 lb

## 2011-09-07 DIAGNOSIS — R079 Chest pain, unspecified: Secondary | ICD-10-CM | POA: Insufficient documentation

## 2011-09-07 DIAGNOSIS — I10 Essential (primary) hypertension: Secondary | ICD-10-CM | POA: Insufficient documentation

## 2011-09-07 DIAGNOSIS — K219 Gastro-esophageal reflux disease without esophagitis: Secondary | ICD-10-CM | POA: Insufficient documentation

## 2011-09-07 DIAGNOSIS — E669 Obesity, unspecified: Secondary | ICD-10-CM | POA: Insufficient documentation

## 2011-09-07 LAB — COMPREHENSIVE METABOLIC PANEL
AST: 24 U/L (ref 0–37)
Albumin: 3.9 g/dL (ref 3.5–5.2)
Alkaline Phosphatase: 95 U/L (ref 39–117)
BUN: 14 mg/dL (ref 6–23)
Chloride: 103 mEq/L (ref 96–112)
Creatinine, Ser: 0.67 mg/dL (ref 0.50–1.10)
Potassium: 3.5 mEq/L (ref 3.5–5.1)
Total Bilirubin: 0.5 mg/dL (ref 0.3–1.2)
Total Protein: 7.5 g/dL (ref 6.0–8.3)

## 2011-09-07 LAB — CBC WITH DIFFERENTIAL/PLATELET
Eosinophils Absolute: 0.1 10*3/uL (ref 0.0–0.7)
Eosinophils Relative: 3 % (ref 0–5)
HCT: 39.8 % (ref 36.0–46.0)
Hemoglobin: 12.7 g/dL (ref 12.0–15.0)
Lymphs Abs: 2 10*3/uL (ref 0.7–4.0)
MCH: 26.3 pg (ref 26.0–34.0)
MCV: 82.4 fL (ref 78.0–100.0)
Monocytes Absolute: 0.3 10*3/uL (ref 0.1–1.0)
Monocytes Relative: 6 % (ref 3–12)
RBC: 4.83 MIL/uL (ref 3.87–5.11)

## 2011-09-07 LAB — TROPONIN I: Troponin I: <0.3 ng/mL (ref <0.30)

## 2011-09-07 MED ORDER — ASPIRIN 81 MG PO CHEW: 81.0000 mg | CHEWABLE_TABLET | Freq: Once | ORAL | Status: AC

## 2011-09-07 MED ORDER — OXYCODONE-ACETAMINOPHEN 5-325 MG PO TABS: 1.0000 | ORAL_TABLET | ORAL | Status: AC | PRN

## 2011-09-07 MED ORDER — KETOROLAC TROMETHAMINE 30 MG/ML IJ SOLN
30.0000 mg | Freq: Once | INTRAMUSCULAR | Status: AC
  Administered 2011-09-07: 30 mg via INTRAVENOUS
  Filled 2011-09-07: qty 1

## 2011-09-07 MED ORDER — FENTANYL CITRATE 0.05 MG/ML IJ SOLN
100.0000 ug | Freq: Once | INTRAMUSCULAR | Status: AC
  Administered 2011-09-07: 100 ug via INTRAVENOUS
  Filled 2011-09-07: qty 2

## 2011-09-07 MED ORDER — IBUPROFEN 600 MG PO TABS: 600.0000 mg | ORAL_TABLET | Freq: Three times a day (TID) | ORAL | Status: AC | PRN

## 2011-09-07 NOTE — ED Notes (Signed)
Husband at bedside.  

## 2011-09-07 NOTE — ED Notes (Signed)
Onset 2 days ago chest pain and back pain continued today seen at urgent care sent to ED for evaluation.  Urgent care gave 324mg  aspirin and started IV left AC 20g. EMS gave 2 nitrol sl pain constant 8/10 pressure.  Ax4.

## 2011-09-07 NOTE — Progress Notes (Signed)
  Subjective:    Patient ID: Diana Thornton, female    DOB: 1945-10-16, 66 y.o.   MRN: [id number]  HPI 66 year old female presents with 3 days of progressively worsening chest pain. States it started Monday morning while at work. 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 The initial pain was sharp and on the left side of her chest. Since then it has worsened to an 8/10 and radiates to her left shoulder blade and down her left arm.  It is a pressure but is severe enough that she did not get any sleep last night.  Denies SOB, headache, vision changes, nausea, vomiting, or abdominal pain.  She has taken Aleve which helps some. Does admit that the pain is worse on exertion.    She has hypertension which is stable.  No history of diabetes.      Review of Systems  All other systems reviewed and are negative.       Objective:   Physical Exam  Constitutional: She is oriented to person, place, and time. She appears well-developed and well-nourished.  HENT:  Head: Normocephalic and atraumatic.  Right Ear: External ear normal.  Left Ear: External ear normal.  Eyes: Conjunctivae are normal.  Neck: Normal range of motion.  Cardiovascular: Normal rate, regular rhythm and normal heart sounds.     Pulmonary/Chest: Effort normal and breath sounds normal.  Musculoskeletal:       Left shoulder: Pain: pain with ROM.  Neurological: She is alert and oriented to person, place, and time.  Psychiatric: She has a normal mood and affect. Her behavior is normal. Judgment and thought content normal.     Seen and discussed with Dr. Patsy Lager.     Assessment & Plan:   1. Chest pain  EKG 12-Lead  Pain likely to be musculoskeletal but will send to ED to rule out cardiac source.  Patient sent to ED via Ambulance for further evaluation and treatment.

## 2011-09-07 NOTE — ED Provider Notes (Signed)
History     CSN: [id number]  Arrival date & time 09/07/11  1047   First MD Initiated Contact with Patient 09/07/11 1052      Chief Complaint  Patient presents with  . Chest Pain    HPI The patient reports she developed left-sided chest discomfort began 2 days ago and has been constant since then.  Is not pleuritic.  It is worse with palpation movement.  She feels under her left breast.  She's noticed no new rash.  She has no new shortness of breath.  She has a history of hypertension and obesity but no history of coronary artery disease.  She does have a history of herpes zoster but is seen no outbreak as of recently.  She reports her pain is constant and moderate in severity.  She denies unilateral leg swelling.  She has no history of DVT or pulmonary embolism   Past Medical History  Diagnosis Date  . HTN (hypertension)   . GERD (gastroesophageal reflux disease)   . Osteoarthritis   . Carpal tunnel syndrome   . Vitamin d deficiency   . Obesity   . Herpes zoster 2003  . Goiter     Past Surgical History  Procedure Date  . Total hip arthroplasty   . Breast reduction surgery   . Cholecystectomy     No family history on file.  History  Substance Use Topics  . Smoking status: Never Smoker   . Smokeless tobacco: Not on file  . Alcohol Use: No    OB History    Grav Para Term Preterm Abortions TAB SAB Ect Mult Living                  Review of Systems  All other systems reviewed and are negative.    Allergies  Morphine and related  Home Medications   Current Outpatient Rx  Name Route Sig Dispense Refill  . ASPIRIN 81 MG PO CHEW Oral Chew 1 tablet (81 mg total) by mouth once. 4 tablet 0  . HYDROCHLOROTHIAZIDE 50 MG PO TABS Oral Take 50 mg by mouth daily.      Marland Kitchen OLMESARTAN MEDOXOMIL 40 MG PO TABS Oral Take 40 mg by mouth daily.      Marland Kitchen POTASSIUM CHLORIDE ER 10 MEQ PO TBCR Oral Take 10 mEq by mouth daily.    Marland Kitchen VERAPAMIL HCL 80 MG PO TABS Oral Take 80 mg by mouth  daily.    . IBUPROFEN 600 MG PO TABS Oral Take 1 tablet (600 mg total) by mouth every 8 (eight) hours as needed for pain. 15 tablet 0  . OXYCODONE-ACETAMINOPHEN 5-325 MG PO TABS Oral Take 1 tablet by mouth every 4 (four) hours as needed for pain. 20 tablet 0    BP 146/77  Pulse 68  Resp 12  SpO2 97%  Physical Exam  Nursing note and vitals reviewed. Constitutional: She is oriented to person, place, and time. She appears well-developed and well-nourished. No distress.  HENT:  Head: Normocephalic and atraumatic.  Eyes: EOM are normal.  Neck: Normal range of motion.  Cardiovascular: Normal rate, regular rhythm and normal heart sounds.   Pulmonary/Chest: Effort normal and breath sounds normal.  Abdominal: Soft. She exhibits no distension. There is no tenderness.  Musculoskeletal: Normal range of motion.  Neurological: She is alert and oriented to person, place, and time.  Skin: Skin is warm and dry.  Psychiatric: She has a normal mood and affect. Judgment normal.    ED  Course  Procedures (including critical care time)   Date: 09/07/2011  Rate: 77  Rhythm: normal sinus rhythm  QRS Axis: normal  Intervals: normal  ST/T Wave abnormalities: normal  Conduction Disutrbances: none  Narrative Interpretation:   Old EKG Reviewed: No significant changes noted     Labs Reviewed  COMPREHENSIVE METABOLIC PANEL - Abnormal; Notable for the following:    Glucose, Bld 103 (*)     All other components within normal limits  CBC WITH DIFFERENTIAL  TROPONIN I   Dg Chest 2 View  09/07/2011  *RADIOLOGY REPORT*  Clinical Data: Left chest pain.  Hypertension.  CHEST - 2 VIEW  Comparison: C T 11/19/2010.  Radiographs 11/12/2010.  Findings: Overall pulmonary aeration has improved.  The lungs are clear.  There is no pleural effusion or pneumothorax.  There is stable mild cardiomegaly.  Osteophytes of the thoracic spine and upper abdominal surgical clips appear stable.  IMPRESSION: Stable cardiomegaly.   No active cardiopulmonary process.  Original Report Authenticated By: Gerrianne Scale, M.D.    I personally reviewed the imaging tests through PACS system  I reviewed available ER/hospitalization records thought the EMR   1. Chest pain       MDM  The patient has had constant left-sided chest pain x2 days that is worse with movement and palpation.  Troponin and EKG are normal.  Chest x-ray is clear.  Discharge home in good condition.  She feels much better after pain medicine here.  Home with pain medicine and anti-inflammatories.        Lyanne Co, MD 09/07/11 1414

## 2011-09-08 MED FILL — Chlorhexidine Gluconate Liquid 4%: CUTANEOUS | Qty: 30 | Status: AC

## 2012-02-22 ENCOUNTER — Ambulatory Visit
Admission: RE | Admit: 2012-02-22 | Discharge: 2012-02-22 | Disposition: A | Discharge status: Home / Self Care | Payer: 59 | Source: Ambulatory Visit | Attending: Internal Medicine | Admitting: Internal Medicine

## 2012-02-22 ENCOUNTER — Other Ambulatory Visit: Payer: Self-pay | Admitting: Internal Medicine

## 2012-02-22 DIAGNOSIS — R062 Wheezing: Secondary | ICD-10-CM

## 2012-02-22 DIAGNOSIS — R05 Cough: Secondary | ICD-10-CM

## 2013-09-17 ENCOUNTER — Other Ambulatory Visit: Payer: Self-pay | Admitting: Internal Medicine

## 2013-09-17 DIAGNOSIS — Z1231 Encounter for screening mammogram for malignant neoplasm of breast: Secondary | ICD-10-CM

## 2013-09-24 ENCOUNTER — Ambulatory Visit
Admission: RE | Admit: 2013-09-24 | Discharge: 2013-09-24 | Disposition: A | Discharge status: Home / Self Care | Payer: 59 | Source: Ambulatory Visit | Attending: Internal Medicine | Admitting: Internal Medicine

## 2013-09-24 ENCOUNTER — Encounter (INDEPENDENT_AMBULATORY_CARE_PROVIDER_SITE_OTHER): Payer: Self-pay

## 2013-09-24 DIAGNOSIS — Z1231 Encounter for screening mammogram for malignant neoplasm of breast: Secondary | ICD-10-CM

## 2014-01-14 ENCOUNTER — Encounter: Payer: Self-pay | Admitting: Cardiovascular Disease

## 2014-01-14 ENCOUNTER — Encounter: Payer: Self-pay | Admitting: Physician Assistant

## 2014-02-10 ENCOUNTER — Encounter (HOSPITAL_COMMUNITY): Payer: Self-pay

## 2014-02-10 ENCOUNTER — Encounter (HOSPITAL_COMMUNITY)
Admission: RE | Admit: 2014-02-10 | Discharge: 2014-02-10 | Disposition: A | Discharge status: Home / Self Care | Payer: 59 | Source: Ambulatory Visit | Attending: Orthopedic Surgery | Admitting: Orthopedic Surgery

## 2014-02-10 DIAGNOSIS — M13872 Other specified arthritis, left ankle and foot: Secondary | ICD-10-CM | POA: Diagnosis not present

## 2014-02-10 DIAGNOSIS — Z01810 Encounter for preprocedural cardiovascular examination: Secondary | ICD-10-CM | POA: Insufficient documentation

## 2014-02-10 DIAGNOSIS — Z01818 Encounter for other preprocedural examination: Secondary | ICD-10-CM | POA: Insufficient documentation

## 2014-02-10 DIAGNOSIS — Z01812 Encounter for preprocedural laboratory examination: Secondary | ICD-10-CM | POA: Diagnosis present

## 2014-02-10 LAB — SURGICAL PCR SCREEN
MRSA, PCR: NEGATIVE
STAPHYLOCOCCUS AUREUS: NEGATIVE

## 2014-02-10 NOTE — Progress Notes (Signed)
Call to pharma. Tech. To complete med. Rec.

## 2014-02-10 NOTE — Progress Notes (Signed)
Pt. Here for PAT today, > 2 weeks before surgery, she will return on 02/21/2014 for lab work. PCR & EKG done today. Pt. Followed by Dr. Cindee LameMoriera- PCP, also has seen Dr. Eden EmmsNishan in the past & had stress / echo.  Pt. Reports that she saw another cardiologist  about a yr. Ago, not sure of the name ,but was told that \\she  doesn't need to return unless she is having some new complaints.

## 2014-02-10 NOTE — Pre-Procedure Instructions (Signed)
Shirlee LimerickJeanette Seider  02/10/2014   Your procedure is scheduled on:  02/27/2014  [facility]    ENTRANCE A  at 5:30 AM.  Call this number if you have problems the morning of surgery: [phone]   Remember:   Do not eat food or drink liquids after midnight.  On WEDNESDAY   Take these medicines the morning of surgery with A SIP OF WATER:NONE   Do not wear jewelry, make-up or nail polish.   Do not wear lotions, powders, or perfumes. You may wear deodorant.   Do not shave 48 hours prior to surgery.   Do not bring valuables to the hospital.  [facility] not responsible  for any belongings or valuables.               Contacts, dentures or bridgework may not be worn into surgery.   Leave suitcase in the car. After surgery it may be brought to your room.   For patients admitted to the hospital, discharge time is determined by your                treatment team.               Patients discharged the day of surgery will not be allowed to drive  home.  Name and phone number of your driver: with spouse  Special Instructions: Special Instructions: [place] - Preparing for Surgery  Before surgery, you can play an important role.  Because skin is not sterile, your skin needs to be as free of germs as possible.  You can reduce the number of germs on you skin by washing with CHG (chlorahexidine gluconate) soap before surgery.  CHG is an antiseptic cleaner which kills germs and bonds with the skin to continue killing germs even after washing.  Please DO NOT use if you have an allergy to CHG or antibacterial soaps.  If your skin becomes reddened/irritated stop using the CHG and inform your nurse when you arrive at Short Stay.  Do not shave (including legs and underarms) for at least 48 hours prior to the first CHG shower.  You may shave your face.  Please follow these instructions carefully:   1.  Shower with CHG Soap the night before surgery and the  morning of  Surgery.  2.  If you choose to wash your hair, wash your hair first as usual with your  normal shampoo.  3.  After you shampoo, rinse your hair and body thoroughly to remove the  Shampoo.  4.  Use CHG as you would any other liquid soap.  You can apply chg directly to the skin and wash gently with scrungie or a clean washcloth.  5.  Apply the CHG Soap to your body ONLY FROM THE NECK DOWN.    Do not use on open wounds or open sores.  Avoid contact with your eyes, ears, mouth and genitals (private parts).  Wash genitals (private parts)   with your normal soap.  6.  Wash thoroughly, paying special attention to the area where your surgery will be performed.  7.  Thoroughly rinse your body with warm water from the neck down.  8.  DO NOT shower/wash with your normal soap after using and rinsing off   the CHG Soap.  9.  Pat yourself dry with a clean towel.            10.  Wear clean pajamas.  11.  Place clean sheets on your bed the night of your first shower and do not sleep with pets.  Day of Surgery  Do not apply any lotions/deodorants the morning of surgery.  Please wear clean clothes to the hospital/surgery center.   Please read over the following fact sheets that you were given: Pain Booklet, Coughing and Deep Breathing, Blood Transfusion Information, Total Joint Packet, MRSA Information and Surgical Site Infection Prevention

## 2014-02-21 ENCOUNTER — Inpatient Hospital Stay (HOSPITAL_COMMUNITY): Admission: RE | Admit: 2014-02-21 | Payer: Medicare Other | Source: Ambulatory Visit

## 2014-02-24 ENCOUNTER — Encounter (HOSPITAL_COMMUNITY): Payer: Self-pay

## 2014-02-24 ENCOUNTER — Encounter (HOSPITAL_COMMUNITY)
Admission: RE | Admit: 2014-02-24 | Discharge: 2014-02-24 | Disposition: A | Discharge status: Home / Self Care | Payer: 59 | Source: Ambulatory Visit | Attending: Orthopedic Surgery | Admitting: Orthopedic Surgery

## 2014-02-24 LAB — BASIC METABOLIC PANEL
Anion gap: 7 (ref 5–15)
BUN: 21 mg/dL (ref 6–23)
CALCIUM: 9.9 mg/dL (ref 8.4–10.5)
CHLORIDE: 106 mmol/L (ref 96–112)
CO2: 26 mmol/L (ref 19–32)
Creatinine, Ser: 1.09 mg/dL (ref 0.50–1.10)
GFR calc Af Amer: 60 mL/min — ABNORMAL LOW (ref >90)
GFR calc non Af Amer: 52 mL/min — ABNORMAL LOW (ref >90)
Glucose, Bld: 107 mg/dL — ABNORMAL HIGH (ref 70–99)
POTASSIUM: 4.5 mmol/L (ref 3.5–5.1)
Sodium: 139 mmol/L (ref 135–145)

## 2014-02-24 LAB — CBC
HCT: 40 % (ref 36.0–46.0)
HEMOGLOBIN: 12.8 g/dL (ref 12.0–15.0)
MCH: 27.3 pg (ref 26.0–34.0)
MCHC: 32 g/dL (ref 30.0–36.0)
MCV: 85.3 fL (ref 78.0–100.0)
Platelets: 263 10*3/uL (ref 150–400)
RBC: 4.69 MIL/uL (ref 3.87–5.11)
RDW: 13.2 % (ref 11.5–15.5)
WBC: 4.9 10*3/uL (ref 4.0–10.5)

## 2014-02-24 LAB — TYPE AND SCREEN
ABO/RH(D): AB POS
Antibody Screen: NEGATIVE

## 2014-02-26 MED ORDER — CEFAZOLIN SODIUM-DEXTROSE 2-3 GM-% IV SOLR
2.0000 g | INTRAVENOUS | Status: AC
  Administered 2014-02-27: 2 g via INTRAVENOUS
  Filled 2014-02-26: qty 50

## 2014-02-27 ENCOUNTER — Inpatient Hospital Stay (HOSPITAL_COMMUNITY): Payer: 59

## 2014-02-27 ENCOUNTER — Inpatient Hospital Stay (HOSPITAL_COMMUNITY): Payer: 59 | Admitting: Anesthesiology

## 2014-02-27 ENCOUNTER — Inpatient Hospital Stay (HOSPITAL_COMMUNITY)
Admission: RE | Admit: 2014-02-27 | Discharge: 2014-02-28 | DRG: 470 | Disposition: A | Discharge status: Home / Self Care | Payer: 59 | Source: Ambulatory Visit | Attending: Orthopedic Surgery | Admitting: Orthopedic Surgery

## 2014-02-27 ENCOUNTER — Encounter (HOSPITAL_COMMUNITY): Payer: Self-pay | Admitting: Anesthesiology

## 2014-02-27 ENCOUNTER — Encounter (HOSPITAL_COMMUNITY)
Admission: RE | Disposition: A | Discharge status: Home / Self Care | Payer: Self-pay | Source: Ambulatory Visit | Attending: Orthopedic Surgery

## 2014-02-27 DIAGNOSIS — M12572 Traumatic arthropathy, left ankle and foot: Secondary | ICD-10-CM | POA: Diagnosis present

## 2014-02-27 DIAGNOSIS — Z96641 Presence of right artificial hip joint: Secondary | ICD-10-CM | POA: Diagnosis present

## 2014-02-27 DIAGNOSIS — E559 Vitamin D deficiency, unspecified: Secondary | ICD-10-CM | POA: Diagnosis present

## 2014-02-27 DIAGNOSIS — M25572 Pain in left ankle and joints of left foot: Secondary | ICD-10-CM | POA: Diagnosis present

## 2014-02-27 DIAGNOSIS — I10 Essential (primary) hypertension: Secondary | ICD-10-CM | POA: Diagnosis present

## 2014-02-27 DIAGNOSIS — Z419 Encounter for procedure for purposes other than remedying health state, unspecified: Secondary | ICD-10-CM

## 2014-02-27 DIAGNOSIS — M19179 Post-traumatic osteoarthritis, unspecified ankle and foot: Secondary | ICD-10-CM | POA: Diagnosis present

## 2014-02-27 HISTORY — PX: TOTAL ANKLE ARTHROPLASTY: SHX811

## 2014-02-27 SURGERY — ARTHROPLASTY, ANKLE, TOTAL
Anesthesia: General | Site: Ankle | Laterality: Left

## 2014-02-27 MED ORDER — HYDROMORPHONE HCL 1 MG/ML IJ SOLN
INTRAMUSCULAR | Status: AC
  Administered 2014-02-27: 0.5 mg via INTRAVENOUS
  Filled 2014-02-27: qty 1

## 2014-02-27 MED ORDER — SODIUM CHLORIDE 0.9 % IV SOLN: INTRAVENOUS | Status: DC

## 2014-02-27 MED ORDER — DEXTROSE 5 % IV SOLN
INTRAVENOUS | Status: DC | PRN
  Administered 2014-02-27: 08:00:00 via INTRAVENOUS

## 2014-02-27 MED ORDER — HYDROMORPHONE HCL 1 MG/ML IJ SOLN: 0.5000 mg | INTRAMUSCULAR | Status: DC | PRN

## 2014-02-27 MED ORDER — ONDANSETRON HCL 4 MG/2ML IJ SOLN
INTRAMUSCULAR | Status: AC
  Filled 2014-02-27: qty 2

## 2014-02-27 MED ORDER — VANCOMYCIN HCL 500 MG IV SOLR
INTRAVENOUS | Status: AC
  Filled 2014-02-27: qty 500

## 2014-02-27 MED ORDER — ARTIFICIAL TEARS OP OINT
TOPICAL_OINTMENT | OPHTHALMIC | Status: AC
  Filled 2014-02-27: qty 3.5

## 2014-02-27 MED ORDER — ONDANSETRON HCL 4 MG/2ML IJ SOLN
INTRAMUSCULAR | Status: DC | PRN
  Administered 2014-02-27: 4 mg via INTRAVENOUS

## 2014-02-27 MED ORDER — SENNA 8.6 MG PO TABS
1.0000 | ORAL_TABLET | Freq: Two times a day (BID) | ORAL | Status: DC
  Administered 2014-02-27 – 2014-02-28 (×2): 8.6 mg via ORAL
  Filled 2014-02-27 (×4): qty 1

## 2014-02-27 MED ORDER — MIDAZOLAM HCL 2 MG/2ML IJ SOLN
0.5000 mg | Freq: Once | INTRAMUSCULAR | Status: AC | PRN
  Administered 2014-02-27: 0.5 mg via INTRAVENOUS

## 2014-02-27 MED ORDER — MIDAZOLAM HCL 5 MG/5ML IJ SOLN
INTRAMUSCULAR | Status: DC | PRN
  Administered 2014-02-27 (×2): 1 mg via INTRAVENOUS

## 2014-02-27 MED ORDER — VERAPAMIL HCL 80 MG PO TABS
80.0000 mg | ORAL_TABLET | Freq: Every day | ORAL | Status: DC
  Administered 2014-02-27 – 2014-02-28 (×2): 80 mg via ORAL
  Filled 2014-02-27 (×2): qty 1

## 2014-02-27 MED ORDER — PHENYLEPHRINE HCL 10 MG/ML IJ SOLN
INTRAMUSCULAR | Status: DC | PRN
  Administered 2014-02-27: 80 ug via INTRAVENOUS

## 2014-02-27 MED ORDER — MEPERIDINE HCL 25 MG/ML IJ SOLN: 6.2500 mg | INTRAMUSCULAR | Status: DC | PRN

## 2014-02-27 MED ORDER — PROPOFOL 10 MG/ML IV BOLUS
INTRAVENOUS | Status: AC
  Filled 2014-02-27: qty 20

## 2014-02-27 MED ORDER — LIDOCAINE HCL (CARDIAC) 20 MG/ML IV SOLN
INTRAVENOUS | Status: AC
  Filled 2014-02-27: qty 5

## 2014-02-27 MED ORDER — BUPIVACAINE-EPINEPHRINE (PF) 0.5% -1:200000 IJ SOLN
INTRAMUSCULAR | Status: DC | PRN
  Administered 2014-02-27: 40 mL via PERINEURAL

## 2014-02-27 MED ORDER — PROMETHAZINE HCL 25 MG/ML IJ SOLN: 6.2500 mg | INTRAMUSCULAR | Status: DC | PRN

## 2014-02-27 MED ORDER — OXYCODONE HCL 5 MG PO TABS
5.0000 mg | ORAL_TABLET | ORAL | Status: DC | PRN
  Administered 2014-02-27 (×4): 10 mg via ORAL
  Administered 2014-02-28: 5 mg via ORAL
  Administered 2014-02-28: 10 mg via ORAL
  Filled 2014-02-27: qty 2
  Filled 2014-02-27: qty 1
  Filled 2014-02-27 (×3): qty 2

## 2014-02-27 MED ORDER — OXYCODONE HCL 5 MG PO TABS
ORAL_TABLET | ORAL | Status: AC
  Administered 2014-02-27: 10 mg via ORAL
  Filled 2014-02-27: qty 2

## 2014-02-27 MED ORDER — MIDAZOLAM HCL 2 MG/2ML IJ SOLN
INTRAMUSCULAR | Status: AC
  Administered 2014-02-27: 0.5 mg via INTRAVENOUS
  Filled 2014-02-27: qty 2

## 2014-02-27 MED ORDER — NAPROXEN SODIUM 275 MG PO TABS: 440.0000 mg | ORAL_TABLET | Freq: Two times a day (BID) | ORAL | Status: DC

## 2014-02-27 MED ORDER — ACETAMINOPHEN 325 MG PO TABS
650.0000 mg | ORAL_TABLET | Freq: Four times a day (QID) | ORAL | Status: DC | PRN
  Administered 2014-02-27: 650 mg via ORAL

## 2014-02-27 MED ORDER — DOCUSATE SODIUM 100 MG PO CAPS
100.0000 mg | ORAL_CAPSULE | Freq: Two times a day (BID) | ORAL | Status: DC
  Administered 2014-02-27 – 2014-02-28 (×2): 100 mg via ORAL
  Filled 2014-02-27 (×3): qty 1

## 2014-02-27 MED ORDER — LACTATED RINGERS IV SOLN
INTRAVENOUS | Status: DC | PRN
Start: 2014-02-27 — End: 2014-02-27
  Administered 2014-02-27 (×2): via INTRAVENOUS

## 2014-02-27 MED ORDER — ACETAMINOPHEN 325 MG PO TABS
ORAL_TABLET | ORAL | Status: AC
  Administered 2014-02-27: 650 mg via ORAL
  Filled 2014-02-27: qty 2

## 2014-02-27 MED ORDER — CHLORHEXIDINE GLUCONATE 4 % EX LIQD
60.0000 mL | Freq: Once | CUTANEOUS | Status: DC
  Filled 2014-02-27: qty 60

## 2014-02-27 MED ORDER — PRAVASTATIN SODIUM 40 MG PO TABS
40.0000 mg | ORAL_TABLET | Freq: Every day | ORAL | Status: DC
  Administered 2014-02-27: 40 mg via ORAL
  Filled 2014-02-27 (×2): qty 1

## 2014-02-27 MED ORDER — ONDANSETRON HCL 4 MG PO TABS: 4.0000 mg | ORAL_TABLET | Freq: Four times a day (QID) | ORAL | Status: DC | PRN

## 2014-02-27 MED ORDER — POTASSIUM CHLORIDE ER 10 MEQ PO TBCR
10.0000 meq | EXTENDED_RELEASE_TABLET | Freq: Every day | ORAL | Status: DC
  Administered 2014-02-28: 10 meq via ORAL
  Filled 2014-02-27 (×2): qty 1

## 2014-02-27 MED ORDER — ONDANSETRON HCL 4 MG/2ML IJ SOLN
4.0000 mg | Freq: Four times a day (QID) | INTRAMUSCULAR | Status: DC | PRN
  Administered 2014-02-27 – 2014-02-28 (×3): 4 mg via INTRAVENOUS
  Filled 2014-02-27 (×2): qty 2

## 2014-02-27 MED ORDER — NAPROXEN 375 MG PO TABS
375.0000 mg | ORAL_TABLET | Freq: Two times a day (BID) | ORAL | Status: DC
  Administered 2014-02-27: 375 mg via ORAL
  Filled 2014-02-27 (×4): qty 1

## 2014-02-27 MED ORDER — HYDROCHLOROTHIAZIDE 50 MG PO TABS
50.0000 mg | ORAL_TABLET | Freq: Every day | ORAL | Status: DC
  Administered 2014-02-27 – 2014-02-28 (×2): 50 mg via ORAL
  Filled 2014-02-27 (×2): qty 1

## 2014-02-27 MED ORDER — SODIUM CHLORIDE 0.9 % IV SOLN
INTRAVENOUS | Status: DC
  Administered 2014-02-27 – 2014-02-28 (×2): via INTRAVENOUS

## 2014-02-27 MED ORDER — FENTANYL CITRATE 0.05 MG/ML IJ SOLN
INTRAMUSCULAR | Status: AC
  Filled 2014-02-27: qty 5

## 2014-02-27 MED ORDER — PROPOFOL 10 MG/ML IV BOLUS
INTRAVENOUS | Status: DC | PRN
  Administered 2014-02-27: 20 mg via INTRAVENOUS
  Administered 2014-02-27: 150 mg via INTRAVENOUS

## 2014-02-27 MED ORDER — MIDAZOLAM HCL 2 MG/2ML IJ SOLN
INTRAMUSCULAR | Status: AC
Start: 2014-02-27 — End: 2014-02-27
  Filled 2014-02-27: qty 2

## 2014-02-27 MED ORDER — HYDROMORPHONE HCL 1 MG/ML IJ SOLN
0.2500 mg | INTRAMUSCULAR | Status: DC | PRN
  Administered 2014-02-27 (×4): 0.5 mg via INTRAVENOUS

## 2014-02-27 MED ORDER — VANCOMYCIN HCL 500 MG IV SOLR
INTRAVENOUS | Status: DC | PRN
  Administered 2014-02-27: 500 mg

## 2014-02-27 MED ORDER — FENTANYL CITRATE 0.05 MG/ML IJ SOLN
INTRAMUSCULAR | Status: DC | PRN
  Administered 2014-02-27: 25 ug via INTRAVENOUS
  Administered 2014-02-27 (×2): 50 ug via INTRAVENOUS

## 2014-02-27 MED ORDER — LIDOCAINE HCL (CARDIAC) 20 MG/ML IV SOLN
INTRAVENOUS | Status: DC | PRN
  Administered 2014-02-27: 20 mg via INTRAVENOUS

## 2014-02-27 MED ORDER — ENOXAPARIN SODIUM 40 MG/0.4ML ~~LOC~~ SOLN
40.0000 mg | SUBCUTANEOUS | Status: DC
  Administered 2014-02-28: 40 mg via SUBCUTANEOUS
  Filled 2014-02-27: qty 0.4

## 2014-02-27 MED ORDER — 0.9 % SODIUM CHLORIDE (POUR BTL) OPTIME
TOPICAL | Status: DC | PRN
  Administered 2014-02-27: 1000 mL

## 2014-02-27 MED ORDER — SPIRONOLACTONE 50 MG PO TABS
50.0000 mg | ORAL_TABLET | Freq: Every day | ORAL | Status: DC
  Administered 2014-02-27 – 2014-02-28 (×2): 50 mg via ORAL
  Filled 2014-02-27 (×2): qty 1

## 2014-02-27 MED ORDER — BACITRACIN ZINC 500 UNIT/GM EX OINT
TOPICAL_OINTMENT | CUTANEOUS | Status: AC
  Filled 2014-02-27: qty 28.35

## 2014-02-27 SURGICAL SUPPLY — 56 items
BANDAGE ESMARK 6X9 LF (GAUZE/BANDAGES/DRESSINGS) ×1 IMPLANT
BLADE RECIPRICATING (BLADE) ×2 IMPLANT
BLADE RECIPRO TAPERED (BLADE) ×3 IMPLANT
BLADE SAGITTAL (BLADE) ×2 IMPLANT
BLADE SURG 15 STRL LF DISP TIS (BLADE) ×2 IMPLANT
BLADE SURG 15 STRL SS (BLADE) ×6
BNDG CMPR 9X6 STRL LF SNTH (GAUZE/BANDAGES/DRESSINGS) ×1
BNDG ESMARK 6X9 LF (GAUZE/BANDAGES/DRESSINGS) ×3
CHLORAPREP W/TINT 26ML (MISCELLANEOUS) ×3 IMPLANT
CORE POLY SLIDING 8MM ANKLE (Ankle) ×2 IMPLANT
COVER SURGICAL LIGHT HANDLE (MISCELLANEOUS) ×3 IMPLANT
CUFF TOURNIQUET SINGLE 34IN LL (TOURNIQUET CUFF) ×3 IMPLANT
DISPOSABLES PACK SBI (PACKS) ×2 IMPLANT
DRAPE C-ARM 42X72 X-RAY (DRAPES) ×3 IMPLANT
DRAPE C-ARMOR (DRAPES) ×3 IMPLANT
DRAPE EXTREMITY T 121X128X90 (DRAPE) ×3 IMPLANT
DRAPE IMP U-DRAPE 54X76 (DRAPES) ×3 IMPLANT
DRSG MEPILEX BORDER 4X4 (GAUZE/BANDAGES/DRESSINGS) ×3 IMPLANT
DRSG MEPITEL 4X7.2 (GAUZE/BANDAGES/DRESSINGS) ×3 IMPLANT
DRSG PAD ABDOMINAL 8X10 ST (GAUZE/BANDAGES/DRESSINGS) ×9 IMPLANT
ELECT REM PT RETURN 9FT ADLT (ELECTROSURGICAL) ×3
ELECTRODE REM PT RTRN 9FT ADLT (ELECTROSURGICAL) ×1 IMPLANT
EVACUATOR 1/8 PVC DRAIN (DRAIN) ×3 IMPLANT
GAUZE SPONGE 4X4 12PLY STRL (GAUZE/BANDAGES/DRESSINGS) ×6 IMPLANT
GLOVE BIO SURGEON STRL SZ 6.5 (GLOVE) ×1 IMPLANT
GLOVE BIO SURGEON STRL SZ7.5 (GLOVE) ×2 IMPLANT
GLOVE BIO SURGEON STRL SZ8 (GLOVE) ×3 IMPLANT
GLOVE BIO SURGEONS STRL SZ 6.5 (GLOVE) ×1
GLOVE BIOGEL PI IND STRL 7.0 (GLOVE) IMPLANT
GLOVE BIOGEL PI IND STRL 8 (GLOVE) ×1 IMPLANT
GLOVE BIOGEL PI INDICATOR 7.0 (GLOVE) ×2
GLOVE BIOGEL PI INDICATOR 8 (GLOVE) ×2
GLOVE SURG SS PI 8.0 STRL IVOR (GLOVE) ×2 IMPLANT
GOWN STRL REUS W/ TWL LRG LVL3 (GOWN DISPOSABLE) ×1 IMPLANT
GOWN STRL REUS W/TWL LRG LVL3 (GOWN DISPOSABLE) ×3
IMPLANT TALAR STAR SZ S LF (Orthopedic Implant) ×2 IMPLANT
IMPLANT TIBIAL STAR SZ L (Orthopedic Implant) ×2 IMPLANT
KIT BASIN OR (CUSTOM PROCEDURE TRAY) ×3 IMPLANT
KIT ROOM TURNOVER OR (KITS) ×3 IMPLANT
NS IRRIG 1000ML POUR BTL (IV SOLUTION) ×3 IMPLANT
PACK TOTAL JOINT (CUSTOM PROCEDURE TRAY) ×3 IMPLANT
PACK UNIVERSAL I (CUSTOM PROCEDURE TRAY) ×3 IMPLANT
PAD ARMBOARD 7.5X6 YLW CONV (MISCELLANEOUS) ×6 IMPLANT
PAD CAST 4YDX4 CTTN HI CHSV (CAST SUPPLIES) ×3 IMPLANT
PADDING CAST COTTON 4X4 STRL (CAST SUPPLIES) ×9
PADDING CAST COTTON 6X4 STRL (CAST SUPPLIES) ×3 IMPLANT
SPONGE GAUZE 4X4 12PLY STER LF (GAUZE/BANDAGES/DRESSINGS) ×2 IMPLANT
SUCTION FRAZIER TIP 10 FR DISP (SUCTIONS) ×3 IMPLANT
SUT ETHILON 3 0 PS 1 (SUTURE) ×3 IMPLANT
SUT MNCRL AB 3-0 PS2 18 (SUTURE) ×3 IMPLANT
SUT PROLENE 3 0 PS 2 (SUTURE) ×3 IMPLANT
SUT VIC AB 0 CT1 27 (SUTURE) ×3
SUT VIC AB 0 CT1 27XBRD ANBCTR (SUTURE) ×1 IMPLANT
TOWEL OR 17X24 6PK STRL BLUE (TOWEL DISPOSABLE) ×3 IMPLANT
TOWEL OR 17X26 10 PK STRL BLUE (TOWEL DISPOSABLE) ×3 IMPLANT
WATER STERILE IRR 1000ML POUR (IV SOLUTION) ×3 IMPLANT

## 2014-02-27 NOTE — Anesthesia Preprocedure Evaluation (Addendum)
Anesthesia Evaluation  Patient identified by MRN, date of birth, ID band Patient awake    Reviewed: Allergy & Precautions, NPO status , Patient's Chart, lab work & pertinent test results, reviewed documented beta blocker date and time   History of Anesthesia Complications Negative for: history of anesthetic complications  Airway Mallampati: II  TM Distance: >3 FB Neck ROM: Full    Dental  (+) Teeth Intact, Dental Advisory Given   Pulmonary neg pulmonary ROS,  breath sounds clear to auscultation        Cardiovascular hypertension, Pt. on medications - anginaRhythm:Regular Rate:Normal  '12 ECHO: EF 70%, grade 1 diastolic dysfunction, valves OK   Neuro/Psych negative neurological ROS  negative psych ROS   GI/Hepatic Neg liver ROS, GERD-  Controlled,Pt states her reflux disappeared after her brother died, that it had been stress while he was ill.   Endo/Other  Morbid obesity  Renal/GU negative Renal ROS     Musculoskeletal  (+) Arthritis -,   Abdominal (+) + obese,   Peds  Hematology   Anesthesia Other Findings   Reproductive/Obstetrics                        Anesthesia Physical Anesthesia Plan  ASA: II  Anesthesia Plan: General   Post-op Pain Management:    Induction: Intravenous  Airway Management Planned: LMA  Additional Equipment:   Intra-op Plan:   Post-operative Plan:   Informed Consent: I have reviewed the patients History and Physical, chart, labs and discussed the procedure including the risks, benefits and alternatives for the proposed anesthesia with the patient or authorized representative who has indicated his/her understanding and acceptance.   Dental advisory given  Plan Discussed with: Surgeon and CRNA  Anesthesia Plan Comments: (Plan routine monitors, GA- LMA OK, popliteal block for post op analgesia)       Anesthesia Quick Evaluation

## 2014-02-27 NOTE — Anesthesia Procedure Notes (Addendum)
Procedure Name: LMA Insertion Date/Time: 02/27/2014 7:38 AM Performed by: Darcey NoraJAMES, KAREN B Pre-anesthesia Checklist: Patient identified, Emergency Drugs available, Suction available and Patient being monitored Patient Re-evaluated:Patient Re-evaluated prior to inductionOxygen Delivery Method: Circle system utilized Preoxygenation: Pre-oxygenation with 100% oxygen Intubation Type: IV induction Ventilation: Mask ventilation without difficulty LMA: LMA inserted LMA Size: 4.0 Number of attempts: 1 Placement Confirmation: positive ETCO2 and breath sounds checked- equal and bilateral Tube secured with: Tape (taped across cheeks) Dental Injury: Teeth and Oropharynx as per pre-operative assessment    Anesthesia Regional Block:  Popliteal block  Pre-Anesthetic Checklist: ,, timeout performed, Correct Patient, Correct Site, Correct Laterality, Correct Procedure, Correct Position, site marked, Risks and benefits discussed,  Surgical consent,  Pre-op evaluation,  At surgeon's request and post-op pain management  Laterality: Left and Lower  Prep: chloraprep       Needles:  Injection technique: Single-shot     Needle Length: 9cm 9 cm Needle Gauge: 22 and 22 G    Additional Needles:  Procedures: nerve stimulator Popliteal block  Nerve Stimulator or Paresthesia:  Response: toe abduction, 0.44 mA, 0.1 ms,  Response: toe dorsiflexion, 0.44 mA, 0.1 ms,   Additional Responses:   Narrative:  Start time: 02/27/2014 7:16 AM End time: 02/27/2014 7:21 AM Injection made incrementally with aspirations every 5 mL.  Performed by: Personally  Anesthesiologist: Erling CruzJACKSON, E. Azriel Dancy  Additional Notes: Pt identified in Holding room.  Monitors applied. Working IV access confirmed. Sterile prep L lateral knee.  #22ga PNS to toe dorsiflexion and abduction twitches at 0.2944mA threshold.  30cc 0.5% Bupivacaine with 1:200k epi injected incrementally after negative test dose. Reprep L prox, ant-medial tibia,  10cc 0.5% Bupivacaine 1:200k epi injected subq for supplementation of saphenous nerve. Patient asymptomatic, VSS, no heme aspirated, tolerated well.  Sandford Craze Storie Heffern, MD

## 2014-02-27 NOTE — Transfer of Care (Signed)
Immediate Anesthesia Transfer of Care Note  Patient: Diana Thornton  Procedure(s) Performed: Procedure(s): LEFT TOTAL ANKLE ARTHOPLASTY (Left)  Patient Location: PACU  Anesthesia Type:General  Level of Consciousness: awake, alert  and patient cooperative  Airway & Oxygen Therapy: Patient Spontanous Breathing and Patient connected to nasal cannula oxygen  Post-op Assessment: Report given to PACU RN, Post -op Vital signs reviewed and stable and Patient moving all extremities  Post vital signs: Reviewed and stable  Last Vitals:  Filed Vitals:   02/27/14 0620  BP: 135/74  Pulse: 66  Temp: 36.3 C  Resp: 16    Complications: No apparent anesthesia complications

## 2014-02-27 NOTE — Progress Notes (Signed)
Post op VS taken on arrival to [facility] [address]. Pt is A&O x4; resting quietly w/ minimal pain; family updated; report given to [place]any, RCharity fundraiser

## 2014-02-27 NOTE — Brief Op Note (Signed)
02/27/2014  9:42 AM  PATIENT:  Diana Thornton  69 y.o. female  PRE-OPERATIVE DIAGNOSIS:  LEFT ANKLE ARTHRITIS  POST-OPERATIVE DIAGNOSIS:  LEFT ANKLE ARTHRITIS  Procedure(s): LEFT TOTAL ANKLE ARTHOPLASTY  SURGEON:  Toni ArthursJohn Wasyl Dornfeld, MD  ASSISTANT: n/a  ANESTHESIA:   General, regional  EBL:  minimal   TOURNIQUET:   Total Tourniquet Time Documented: Thigh (Left) - 90 minutes Total: Thigh (Left) - 90 minutes   COMPLICATIONS:  None apparent  DISPOSITION:  Extubated, awake and stable to recovery.  DICTATION ID:  [id number]

## 2014-02-27 NOTE — Evaluation (Signed)
Physical Therapy Evaluation Patient Details Name: Diana Thornton MRN: [id number] DOB: 1945/11/23 Today's Date: 02/27/2014   History of Present Illness  Patient is a 69 y/o female s/p Left TOTAL ANKLE ARTHOPLASTY.  Clinical Impression  Patient presents with functional limitations due to deficits listed in PT problem list (see below). Pt with fatigue and mild balance deficits secondary to NWB status LLE. Tolerated short distance ambulation to bathroom. Compliant with WB status. Pt will have knee scooter for home. Pt would benefit from 1 additional follow up to improve gait distance, balance and mobility so pt can maximize independence prior to return home.    Follow Up Recommendations No PT follow up;Supervision/Assistance - 24 hour (Pt does not want HHPT)    Equipment Recommendations  None recommended by PT    Recommendations for Other Services       Precautions / Restrictions Precautions Precautions: Fall Restrictions Weight Bearing Restrictions: Yes LLE Weight Bearing: Non weight bearing      Mobility  Bed Mobility Overal bed mobility: Needs Assistance Bed Mobility: Supine to Sit;Sit to Supine     Supine to sit: Min guard;HOB elevated Sit to supine: Min guard   General bed mobility comments: Use of rails for support. Able to reposition self in bed using rails.  Transfers Overall transfer level: Needs assistance Equipment used: Rolling walker (2 wheeled) Transfers: Sit to/from Stand Sit to Stand: Min guard         General transfer comment: Min guard for safety with cues for hand placement. Compliant with WB status LLE. Stood from [employer], from toilet x1.   Ambulation/Gait Ambulation/Gait assistance: Min guard Ambulation Distance (Feet): 15 Feet (x2 bouts) Assistive device: Rolling walker (2 wheeled) Gait Pattern/deviations: Step-to pattern;Decreased stride length;Trunk flexed     General Gait Details: Pt tolerated hopping into bathroom with increased WB  through BUEs. Compliant with NWB LLE. + nausea.  Stairs            Wheelchair Mobility    Modified Rankin (Stroke Patients Only)       Balance Overall balance assessment: Needs assistance Sitting-balance support: Feet supported;No upper extremity supported Sitting balance-Leahy Scale: Good     Standing balance support: During functional activity Standing balance-Leahy Scale: Fair Standing balance comment: Tolerated dynamic standing activities at sink with UE support at times for balance.                              Pertinent Vitals/Pain Pain Assessment: No/denies pain    Home Living Family/patient expects to be discharged to:: Private residence Living Arrangements: Spouse/significant other Available Help at Discharge: Family;Available PRN/intermittently Type of Home: House Home Access: Ramped entrance     Home Layout: One level Home Equipment: Walker - 2 wheels;Other (comment) (Pt renting a knee scooter)      Prior Function Level of Independence: Independent               Hand Dominance        Extremity/Trunk Assessment   Upper Extremity Assessment: Overall WFL for tasks assessed           Lower Extremity Assessment: LLE deficits/detail   LLE Deficits / Details: Not able to wiggle toes yet. Able to perform LAQ and SLR.      Communication   Communication: No difficulties  Cognition Arousal/Alertness: Awake/alert Behavior During Therapy: WFL for tasks assessed/performed Overall Cognitive Status: Within Functional Limits for tasks assessed  General Comments General comments (skin integrity, edema, etc.): Education provided on elevation of LLE and importance of mobility.     Exercises        Assessment/Plan    PT Assessment Patient needs continued PT services  PT Diagnosis Difficulty walking   PT Problem List Decreased activity tolerance;Decreased balance;Decreased mobility  PT Treatment  Interventions Gait training;Balance training;Patient/family education;Functional mobility training;Therapeutic activities;Therapeutic exercise   PT Goals (Current goals can be found in the Care Plan section) Acute Rehab PT Goals Patient Stated Goal: to return home tomorrow PT Goal Formulation: With patient Time For Goal Achievement: 03/13/14 Potential to Achieve Goals: Fair    Frequency Min 3X/week   Barriers to discharge Decreased caregiver support Pt 's spouse works    Co-evaluation               End of Session Equipment Utilized During Treatment: Gait belt Activity Tolerance: Patient tolerated treatment well Patient left: in bed;with call bell/phone within reach;with bed alarm set Nurse Communication: Mobility status         Time: 5621-3086 PT Time Calculation (min) (ACUTE ONLY): 21 min   Charges:   PT Evaluation $Initial PT Evaluation Tier I: 1 Procedure     PT G CodesAlvie Heidelberg A 03-19-2014, 5:01 PM  Alvie Heidelberg, PT, DPT [phone]

## 2014-02-27 NOTE — H&P (Signed)
Diana LimerickJeanette Thornton is an 69 y.o. female.   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  She has failed nonoperative treatment and presents now for left total ankle replacement.  Past Medical History  Diagnosis Date  . HTN (hypertension)   . Osteoarthritis   . Vitamin D deficiency   . Obesity   . Herpes zoster 2003  . Goiter   . GERD (gastroesophageal reflux disease)     resolved after her brother passed away   . Carpal tunnel syndrome     L hand    Past Surgical History  Procedure Laterality Date  . Total hip arthroplasty    . Breast reduction surgery    . Cholecystectomy    . Joint replacement      Right hip, replacement x2   . Abdominal hysterectomy    . Tubal ligation    . Dilation and curettage of uterus    . Breast surgery      History reviewed. No pertinent family history. Social History:  reports that she has never smoked. She does not have any smokeless tobacco history on file. She reports that she does not drink alcohol or use illicit drugs.  Allergies:  Allergies  Allergen Reactions  . Morphine And Related Rash    Medications Prior to Admission  Medication Sig Dispense Refill  . hydrochlorothiazide (HYDRODIURIL) 50 MG tablet Take 50 mg by mouth daily.      . potassium chloride (K-DUR) 10 MEQ tablet Take 10 mEq by mouth daily.    . pravastatin (PRAVACHOL) 40 MG tablet Take 40 mg by mouth daily.    Marland Kitchen. spironolactone (ALDACTONE) 25 MG tablet Take 50 mg by mouth daily.    . verapamil (CALAN) 80 MG tablet Take 80 mg by mouth daily.    . naproxen sodium (ANAPROX) 220 MG tablet Take 440-1,760 mg by mouth 2 (two) times daily with a meal.      No results found for this or any previous visit (from the past 48 hour(s)). No results found.  ROS  No recent f/c/n/v/wt loss  Blood pressure 135/74, pulse 66, temperature 97.4 F (36.3 C), temperature source Oral, resp. rate 16, SpO2 100 %. Physical Exam  wn wd  woman in nad.  A and O x 4.  Mood and affect normal.  EOMI.  resp unlabored.  L ankle with healthy skin.  No lymphadenopathy.  5/5 strength in PF and DF of the ankle.  2+ dp and pt pulses.  Feels LT in dorsal and plantar foot.  Assessment/Plan Left post traumatic ankle arthritis - to OR for left total ankle replacement.  The risks and benefits of the alternative treatment options have been discussed in detail.  The patient wishes to proceed with surgery and specifically understands risks of bleeding, infection, nerve damage, blood clots, need for additional surgery, amputation and death.   Diana ArthursHEWITT, Diana Thornton 02/27/2014, 7:20 AM

## 2014-02-27 NOTE — Anesthesia Postprocedure Evaluation (Signed)
  Anesthesia Post-op Note  Patient: Arts administratorJeanette Thornton  Procedure(s) Performed: Procedure(s): LEFT TOTAL ANKLE ARTHOPLASTY (Left)  Patient Location: PACU  Anesthesia Type:GA combined with regional for post-op pain  Level of Consciousness: sedated, patient cooperative and responds to stimulation  Airway and Oxygen Therapy: Patient Spontanous Breathing and Patient connected to nasal cannula oxygen  Post-op Pain: mild  Post-op Assessment: Post-op Vital signs reviewed, Patient's Cardiovascular Status Stable, Respiratory Function Stable, Patent Airway, No signs of Nausea or vomiting and Pain level controlled  Post-op Vital Signs: Reviewed and stable  Last Vitals:  Filed Vitals:   02/27/14 1000  BP: 143/66  Pulse:   Temp:   Resp:     Complications: No apparent anesthesia complications

## 2014-02-28 ENCOUNTER — Encounter (HOSPITAL_COMMUNITY): Payer: Self-pay | Admitting: Orthopedic Surgery

## 2014-02-28 MED ORDER — OXYCODONE HCL 5 MG PO TABS: 5.0000 mg | ORAL_TABLET | ORAL | Status: DC | PRN

## 2014-02-28 MED ORDER — ACETAMINOPHEN 325 MG PO TABS: 650.0000 mg | ORAL_TABLET | Freq: Four times a day (QID) | ORAL | Status: AC | PRN

## 2014-02-28 MED ORDER — ASPIRIN EC 325 MG PO TBEC: 325.0000 mg | DELAYED_RELEASE_TABLET | Freq: Every day | ORAL | Status: AC

## 2014-02-28 MED ORDER — SENNOSIDES 8.6 MG PO TABS: 2.0000 | ORAL_TABLET | Freq: Every day | ORAL | Status: DC

## 2014-02-28 MED ORDER — DOCUSATE SODIUM 100 MG PO CAPS: 100.0000 mg | ORAL_CAPSULE | Freq: Two times a day (BID) | ORAL | Status: DC

## 2014-02-28 NOTE — Progress Notes (Signed)
Physical Therapy Treatment Patient Details Name: Diana Thornton MRN: [id number] DOB: 01/21/1946 Today's Date: 02/28/2014    History of Present Illness Patient is a 69 y/o female s/p Left TOTAL ANKLE ARTHOPLASTY.    PT Comments    Pt. Still needing min guard assist for safety and stability.  I discussed with her that I recommend her husband be close by for contact guard assist as she mobilizes in the home and that I recommend HHPT.  She is agreeable to both.   Follow Up Recommendations  Home health PT (pat. agreeable to HHPT today)     Equipment Recommendations  None recommended by PT    Recommendations for Other Services       Precautions / Restrictions Precautions Precautions: Fall Precaution Comments: Educated pt. that she should have spouse close by to provide contact guard assist until cleared by HHPT , for added stability and to reduce fall risk Required Braces or Orthoses:  (cast dry and intact) Restrictions Weight Bearing Restrictions: Yes LLE Weight Bearing: Non weight bearing    Mobility  Bed Mobility Overal bed mobility: Modified Independent Bed Mobility: Supine to Sit           General bed mobility comments: pt. managing herself and L LE without external support; used bed rail to rise to sitting   Transfers Overall transfer level: Needs assistance Equipment used: Rolling walker (2 wheeled) Transfers: Sit to/from Stand Sit to Stand: Min guard         General transfer comment: min guard assist for added safety and stability; good hand placement today.  Pt. practiced rising from bed and rise and sit from recliner chair and toilet  Ambulation/Gait Ambulation/Gait assistance: Min guard Ambulation Distance (Feet): 50 Feet (30 then 20') Assistive device: Rolling walker (2 wheeled) Gait Pattern/deviations: Step-to pattern;Decreased stride length;Trunk flexed Gait velocity: decreased   General Gait Details: Pt. mildly nauseaus which limited her  distance.  Pt. is fully compliant with NWB status.  Pt. says she has been educated on use of knee walker at the rental company.  Pt. able to complete 2 short distance trials of ambulation   Stairs Stairs:  (no, pt. has ramp entry)          Wheelchair Mobility    Modified Rankin (Stroke Patients Only)       Balance Overall balance assessment: Needs assistance Sitting-balance support: Feet supported;No upper extremity supported Sitting balance-Leahy Scale: Good     Standing balance support: Bilateral upper extremity supported;During functional activity Standing balance-Leahy Scale: Poor (needs support of RW but is fairly stable) Standing balance comment: dynamic activities at sink with bilateral UE support                    Cognition Arousal/Alertness: Awake/alert Behavior During Therapy: WFL for tasks assessed/performed Overall Cognitive Status: Within Functional Limits for tasks assessed                      Exercises      General Comments        Pertinent Vitals/Pain Pain Assessment: No/denies pain    Home Living                      Prior Function            PT Goals (current goals can now be found in the care plan section) Progress towards PT goals: Progressing toward goals    Frequency  Min 3X/week  PT Plan Current plan remains appropriate    Co-evaluation             End of Session Equipment Utilized During Treatment: Gait belt Activity Tolerance: Patient tolerated treatment well Patient left: in chair;with call bell/phone within reach     Time: 0950-1015 PT Time Calculation (min) (ACUTE ONLY): 25 min  Charges:  $Gait Training: 23-37 mins                    G Codes:      Ferman Hamming 02/28/2014, 10:42 AM Weldon Picking PT Acute Rehab Services [phone] Beeper [phone]

## 2014-02-28 NOTE — Progress Notes (Signed)
02/28/14 PT recommended HHPT.Spoke with patient, she states that she does not think that she needs HHPT due to her having worked with therapy before surgery and her husband and a friend will be available 24/7 to assist her. Patient states that she has a rolling walker and a knee walker at home. No equipment needs identified. Will continue to follow until d/c.

## 2014-02-28 NOTE — Op Note (Signed)
NAME:  Diana Thornton, Diana Thornton NO.:  [id number]  MEDICAL RECORD NO.:  [id number]  LOCATION:  [id number]                        FACILITY:  [facility]  PHYSICIAN:  Toni Arthurs, MD        DATE OF BIRTH:  1963-06-03  DATE OF PROCEDURE:  02/27/2014 DATE OF DISCHARGE:                              OPERATIVE REPORT   PREOPERATIVE DIAGNOSIS:  Left ankle posttraumatic arthritis.  POSTOPERATIVE DIAGNOSIS:  Left ankle posttraumatic arthritis.  PROCEDURE:  Left total ankle replacement.  SURGEON:  Toni Arthurs, MD  ANESTHESIA:  General, regional.  ESTIMATED BLOOD LOSS:  Minimal.  TOURNIQUET TIME:  90 minutes at 250 mmHg.  COMPLICATIONS:  None apparent.  DISPOSITION:  Extubated, awake and stable to recovery.  INDICATIONS FOR PROCEDURE:  The patient is a 69 year old woman with a past medical history significant for left posttraumatic ankle arthritis. She has failed nonoperative treatment and presents today for left total ankle replacement.  She understands the risks and benefits, the alternative treatment options, and elects to proceed with surgical treatment.  She specifically understands risks of bleeding, infection, nerve damage, blood clots, need for additional surgery, continued pain, amputation, and death.  PROCEDURE IN DETAIL:  After preoperative consent was obtained and the correct operative site was identified, the patient was brought to the operating room and placed supine on the operating table.  General anesthesia was induced.  Preoperative antibiotics were administered. Surgical time-out was taken.  The left lower extremity was prepped and draped in standard sterile fashion with tourniquet around the thigh. The extremity was exsanguinated and the tourniquet was inflated to 250 mmHg.  A longitudinal incision was then made over the anterior aspect of the ankle.  Sharp dissection was carried down through the skin and subcutaneous tissue.  The extensor retinaculum was incised  over the extensor hallucis longus tendon.  It was released proximally and distally.  The interval between the tibialis anterior and EHL was then developed taking care to protect the neurovascular bundle.  The anterior joint capsule was elevated with a scalpel.  The joint was exposed and the anterior osteophytes were resected with the rongeur.  A stab incision was then made at the level of the tibial tubercle and a 3.2-mm guidepin was inserted, in line with a straight osteotome placed in the medial gutter.  The external cutting guide was then inserted and aligned in the AP and lateral planes with the tibial crest.  The rotation of the distal cutting guide was set parallel to the medial gutter and the cutting guide was secured.  The Angel wing was used to set the resection level at the level of the superior tibial plafond.  The distal cutting guide was then pinned into position.  The medial-lateral cut was set in line with the medial gutter.  The distal cut was then made with reciprocating and oscillating saws.  The cutting guide was removed and the distal tibial bone was removed.  The resection level was verified with the black guide.  The talar cutting block was then inserted and tightened.  The talus was secured to the guide with guidepins.  Lateral radiographs confirmed appropriate position of the guide.  Superior talar cut was then made and the cut  bone was removed.  The talar cut was sized as a small.  The size small datum was then inserted in its appropriate position and secured.  The anterior-posterior cuts were made and all waste bone was removed.  The medial and lateral cuts were then made. The window trial was inserted and was noted to have the appropriate fit. It was secured with pins and the keel hole was drilled and punched.  The wound was irrigated copiously.  A small amount of vancomycin powder was placed on the cut surface of the talus.  The size small STAR implant  was inserted and impacted into position.  Lateral radiograph confirmed appropriate position of that implant.  The distal tibia was cut and a size large tibial trial was inserted.  Appropriate alignment was confirmed on AP and lateral radiographs.  The proximal holes were then drilled and punched.  The wound was irrigated again copiously and more vancomycin powder was sprinkled on the wound.  The tibial implant was inserted and impacted into position.  AP and lateral radiographs confirmed appropriate position of this implant.  A size 8-mm polyethylene trial was inserted.  The ankle was noted to have appropriate balance and range of motion.  Trial was removed.  The wound was irrigated copiously.  More vancomycin powder was sprinkled on the wound and an 8-mm polyethylene spacer was inserted.  The final AP, mortise, and lateral radiographs were obtained confirming appropriate position of all hardware.  The wound was irrigated and vancomycin powder was sprinkled in the deep portion of the wound.  The anterior joint capsule and extensor retinaculum were then closed with simple sutures of 0 Vicryl.  Subcutaneous tissues were approximated with inverted simple sutures of 3-0 Monocryl and a running 3-0 nylon was used to close the skin incision.  The proximal guidepin was removed and that wound was irrigated and closed with 3-0 Prolene.  Sterile dressings were applied followed by well-padded short-leg cast.  The tourniquet was released at 90 minutes after application of the dressings.  The patient was then awakened from anesthesia and transported to the recovery room in stable condition.  FOLLOWUP PLAN:  The patient will be nonweightbearing on the left lower extremity.  She will do physical therapy and will likely be discharged home tomorrow.     Toni Arthurs, MD     JH/MEDQ  D:  02/27/2014  T:  02/28/2014  Job:  [id number]

## 2014-02-28 NOTE — Discharge Instructions (Signed)
Diana Caudill, MD °[facility] ° °Please read the following information regarding your care after surgery. ° °Medications  °You only need a prescription for the narcotic pain medicine (ex. oxycodone, Percocet, Norco).  All of the other medicines listed below are available over the counter. °X acetominophen (Tylenol) 650 mg every 4-6 hours as you need for minor pain °X oxycodone as prescribed for moderate to severe pain °?  ° °Narcotic pain medicine (ex. oxycodone, Percocet, Vicodin) will cause constipation.  To prevent this problem, take the following medicines while you are taking any pain medicine. °X docusate sodium (Colace) 100 mg twice a day X senna (Senokot) 2 tablets twice a day ° °X To help prevent blood clots, take an aspirin (325 mg) once a day for a month after surgery.  You should also get up every hour while you are awake to move around.   ° °Weight Bearing °? Bear weight when you are able on your operated leg or foot. °? Bear weight only on the heel of your operated foot in the post-op shoe. °X Do not bear any weight on the operated leg or foot. ° °Cast / Splint / Dressing °X Keep your splint or cast clean and dry.  Don’t put anything (coat hanger, pencil, etc) down inside of it.  If it gets damp, use a hair dryer on the cool setting to dry it.  If it gets soaked, call the office to schedule an appointment for a cast change. °? Remove your dressing 3 days after surgery and cover the incisions with dry dressings.   ° °After your dressing, cast or splint is removed; you may shower, but do not soak or scrub the wound.  Allow the water to run over it, and then gently pat it dry. ° °Swelling °It is normal for you to have swelling where you had surgery.  To reduce swelling and pain, keep your toes above your nose for at least 3 days after surgery.  It may be necessary to keep your foot or leg elevated for several weeks.  If it hurts, it should be elevated. ° °Follow Up °Call my office at  [phone] when you are discharged from the hospital or surgery center to schedule an appointment to be seen two weeks after surgery. ° °Call my office at [phone] if you develop a fever >101.5° F, nausea, vomiting, bleeding from the surgical site or severe pain.   ° ° °

## 2014-02-28 NOTE — Discharge Summary (Signed)
Physician Discharge Summary  Patient ID: Diana Thornton MRN: [id number] DOB/AGE: 06/19/1945 69 y.o.  Admit date: 02/27/2014 Discharge date: 02/28/2014  Admission Diagnoses:  Htn, left ankle arthritis  Discharge Diagnoses:  Active Problems:   Post-traumatic arthritis of ankle s/p left total ankle replacement  Discharged Condition: stable  Hospital Course: Pt was admitted and taken to the [facility] for left total ankle replacement.  She tolerated the operation well and did well post operatively.  She did well with PT and is discharged to home in stable condition.  Consults: None  Significant Diagnostic Studies:  none  Treatments: surgery: left TAR  Discharge Exam: Blood pressure 114/64, pulse 81, temperature 98.5 F (36.9 C), temperature source Oral, resp. rate 18, SpO2 97 %. wn wd woman in nad.  L ankle casted.  NVI at toes.  Disposition: 01-Home or Self Care  Discharge Instructions    Call MD / Call 911    Complete by:  As directed   If you experience chest pain or shortness of breath, CALL 911 and be transported to the hospital emergency room.  If you develope a fever above 101 F, pus (white drainage) or increased drainage or redness at the wound, or calf pain, call your surgeon's office.     Constipation Prevention    Complete by:  As directed   Drink plenty of fluids.  Prune juice may be helpful.  You may use a stool softener, such as Colace (over the counter) 100 mg twice a day.  Use MiraLax (over the counter) for constipation as needed.     Diet - low sodium heart healthy    Complete by:  As directed      Increase activity slowly as tolerated    Complete by:  As directed      Non weight bearing    Complete by:  As directed   Laterality:  left  Extremity:  Lower            Medication List    TAKE these medications        acetaminophen 325 MG tablet  Commonly known as:  TYLENOL  Take 2 tablets (650 mg total) by mouth every 6 (six) hours as needed for headache or  mild pain.     aspirin EC 325 MG tablet  Take 1 tablet (325 mg total) by mouth daily.     docusate sodium 100 MG capsule  Commonly known as:  COLACE  Take 1 capsule (100 mg total) by mouth 2 (two) times daily. While taking narcotic pain medicine.     hydrochlorothiazide 50 MG tablet  Commonly known as:  HYDRODIURIL  Take 50 mg by mouth daily.     naproxen sodium 220 MG tablet  Commonly known as:  ANAPROX  Take 440-1,760 mg by mouth 2 (two) times daily with a meal.     oxyCODONE 5 MG immediate release tablet  Commonly known as:  ROXICODONE  Take 1-2 tablets (5-10 mg total) by mouth every 4 (four) hours as needed for moderate pain or severe pain.     potassium chloride 10 MEQ tablet  Commonly known as:  K-DUR  Take 10 mEq by mouth daily.     pravastatin 40 MG tablet  Commonly known as:  PRAVACHOL  Take 40 mg by mouth daily.     senna 8.6 MG tablet  Commonly known as:  SENOKOT  Take 2 tablets (17.2 mg total) by mouth daily. While taking narcotic pain medicine.     spironolactone  25 MG tablet  Commonly known as:  ALDACTONE  Take 50 mg by mouth daily.     verapamil 80 MG tablet  Commonly known as:  CALAN  Take 80 mg by mouth daily.           Follow-up Information    Follow up with Lisle Skillman, Jonny Ruiz, MD. Schedule an appointment as soon as possible for a visit in 2 weeks.   Specialty:  Orthopedic Surgery   Contact information:   [address] [place] [address] [phone]       Signed: Toni Arthurs 02/28/2014, 7:56 AM

## 2014-02-28 NOTE — Evaluation (Signed)
Occupational Therapy Evaluation Patient Details Name: Diana Thornton MRN: [id number] DOB: 1945-03-17 Today's Date: 02/28/2014    History of Present Illness Patient is a 69 y/o female s/p Left TOTAL ANKLE ARTHOPLASTY.   Clinical Impression   Patient evaluated by Occupational Therapy with no further acute OT needs identified. All education has been completed and the patient has no further questions. All education completed.  Pt able to complete BADLs with min A - min guard assist.  She will have adequate assist at discharge.  See below for any follow-up Occupational Therapy or equipment needs. OT is signing off. Thank you for this referral.      Follow Up Recommendations  No OT follow up;Supervision/Assistance - 24 hour    Equipment Recommendations  None recommended by OT    Recommendations for Other Services       Precautions / Restrictions Precautions Precautions: Fall Precaution Comments: Educated pt. that she should have spouse close by to provide contact guard assist until cleared by HHPT , for added stability and to reduce fall risk Required Braces or Orthoses:  (cast dry and intact) Restrictions Weight Bearing Restrictions: Yes LLE Weight Bearing: Non weight bearing      Mobility Bed Mobility Overal bed mobility: Modified Independent Bed Mobility: Supine to Sit           General bed mobility comments: pt. managing herself and L LE without external support; used bed rail to rise to sitting   Transfers Overall transfer level: Needs assistance Equipment used: Rolling walker (2 wheeled) Transfers: Sit to/from [employer] Sit to Stand: Min guard Stand pivot transfers: Min guard       General transfer comment: min guard assist for balance     Balance Overall balance assessment: Needs assistance Sitting-balance support: Feet supported Sitting balance-Leahy Scale: Good     Standing balance support: Single extremity supported;During  functional activity Standing balance-Leahy Scale: Poor Standing balance comment: requires unilateral UE assist                             ADL Overall ADL's : Needs assistance/impaired Eating/Feeding: Independent   Grooming: Wash/dry hands;Wash/dry face;Oral care;Brushing hair;Min guard;Standing   Upper Body Bathing: Set up;Sitting   Lower Body Bathing: Min guard;Sit to/from stand   Upper Body Dressing : Set up;Sitting   Lower Body Dressing: Min guard;Sit to/from stand   Toilet Transfer: Min guard;Ambulation;Comfort height toilet;BSC;RW   Toileting- Architect and Hygiene: Min guard;Sit to/from stand   Tub/ Shower Transfer: Minimal assistance;Ambulation;Shower seat;Rolling walker;Walk-in shower Tub/Shower Transfer Details (indicate cue type and reason): Min A for balance.  Pt reports spouse will be able to assist her Functional mobility during ADLs: Min guard;Rolling walker General ADL Comments: Pt requires min guard assist for balance.  She reports that her husband will provide support/assist when needed and will be with her when she is up.   She did c/o "light headedness" when ambulating which improved once she sat back down and cold washcloth applied to forehead     Vision                     Perception     Praxis      Pertinent Vitals/Pain Pain Assessment: 0-10 Pain Score: 4  Pain Location: Lt ankle  Pain Descriptors / Indicators: Aching;Constant Pain Intervention(s): Premedicated before session;Monitored during session;Repositioned     Hand Dominance     Extremity/Trunk Assessment Upper Extremity Assessment  Upper Extremity Assessment: Overall WFL for tasks assessed   Lower Extremity Assessment Lower Extremity Assessment: Defer to PT evaluation       Communication Communication Communication: No difficulties   Cognition Arousal/Alertness: Awake/alert Behavior During Therapy: WFL for tasks assessed/performed Overall  Cognitive Status: Within Functional Limits for tasks assessed                     General Comments       Exercises       Shoulder Instructions      Home Living Family/patient expects to be discharged to:: Private residence Living Arrangements: Spouse/significant other Available Help at Discharge: Family;Available PRN/intermittently Type of Home: House Home Access: Ramped entrance     Home Layout: One level     Bathroom Shower/Tub: Producer, television/film/video: Handicapped height Bathroom Accessibility: Yes How Accessible: Accessible via walker Home Equipment: Walker - 2 wheels;Other (comment)          Prior Functioning/Environment Level of Independence: Independent             OT Diagnosis: Generalized weakness;Acute pain   OT Problem List:     OT Treatment/Interventions:      OT Goals(Current goals can be found in the care plan section) Acute Rehab OT Goals Patient Stated Goal: to go home  OT Goal Formulation: All assessment and education complete, DC therapy  OT Frequency:     Barriers to D/C:            Co-evaluation              End of Session Equipment Utilized During Treatment: Rolling walker Nurse Communication: Mobility status  Activity Tolerance: Patient tolerated treatment well Patient left: in chair;with call bell/phone within reach   Time: 1021-1033 OT Time Calculation (min): 12 min Charges:  OT General Charges $OT Visit: 1 Procedure OT Evaluation $Initial OT Evaluation Tier I: 1 Procedure G-Codes:    Jeani Hawking M Mar 07, 2014, 10:57 AM

## 2015-07-10 ENCOUNTER — Ambulatory Visit
Admission: RE | Admit: 2015-07-10 | Discharge: 2015-07-10 | Disposition: A | Discharge status: Home / Self Care | Payer: 59 | Source: Ambulatory Visit | Attending: Internal Medicine | Admitting: Internal Medicine

## 2015-07-10 ENCOUNTER — Other Ambulatory Visit: Payer: Self-pay | Admitting: Internal Medicine

## 2015-07-10 DIAGNOSIS — R0602 Shortness of breath: Secondary | ICD-10-CM

## 2015-07-10 DIAGNOSIS — R05 Cough: Secondary | ICD-10-CM

## 2015-07-10 DIAGNOSIS — R059 Cough, unspecified: Secondary | ICD-10-CM

## 2015-08-05 IMAGING — RF DG C-ARM 61-120 MIN
1 series · 3 of 3 positions shown · non-contrast
Comparison: None

CLINICAL DATA: Ankle osteoarthritis.  Ankle arthroplasty.

EXAM:
DG C-ARM 61-120 MIN; LEFT ANKLE COMPLETE - 3+ VIEW
:

[Series 1: run · 3 of 3 slices shown]
[im 1/3]
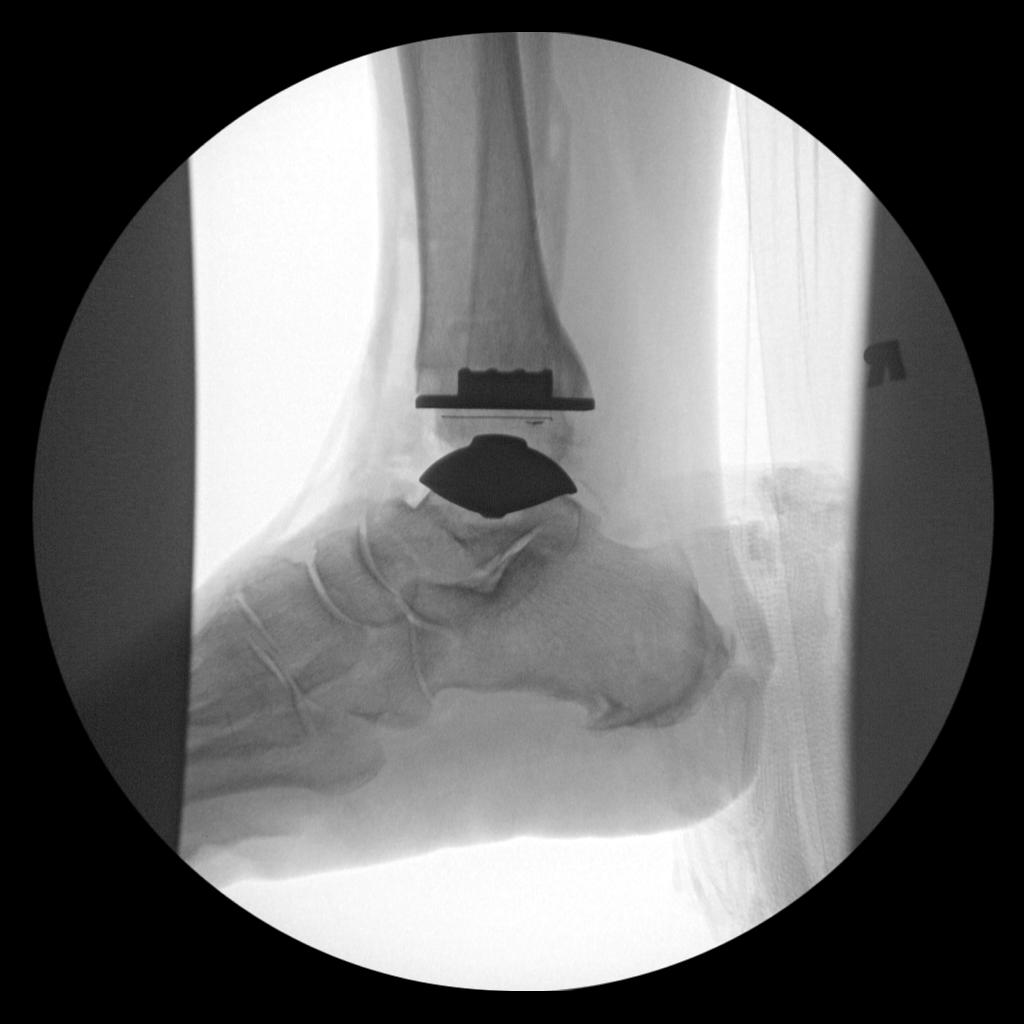
[im 2/3]
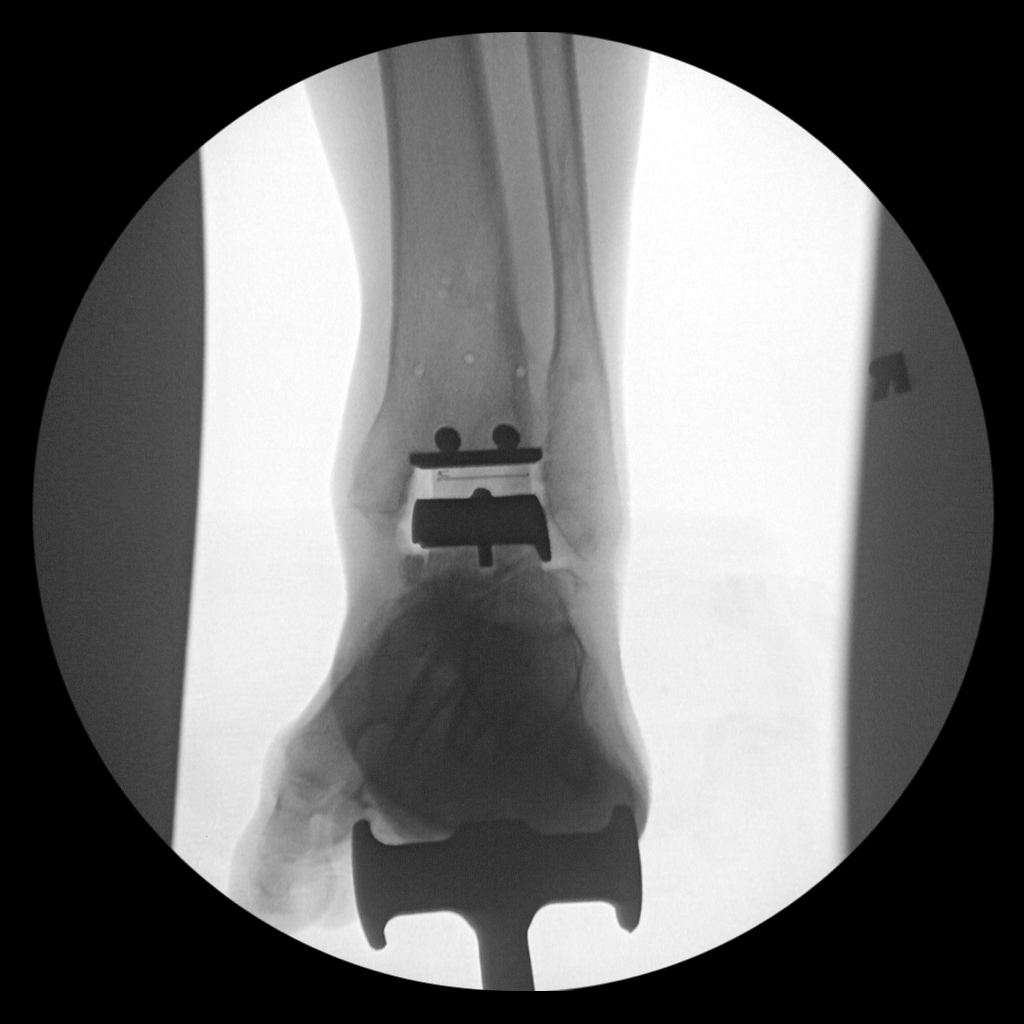
[im 3/3]
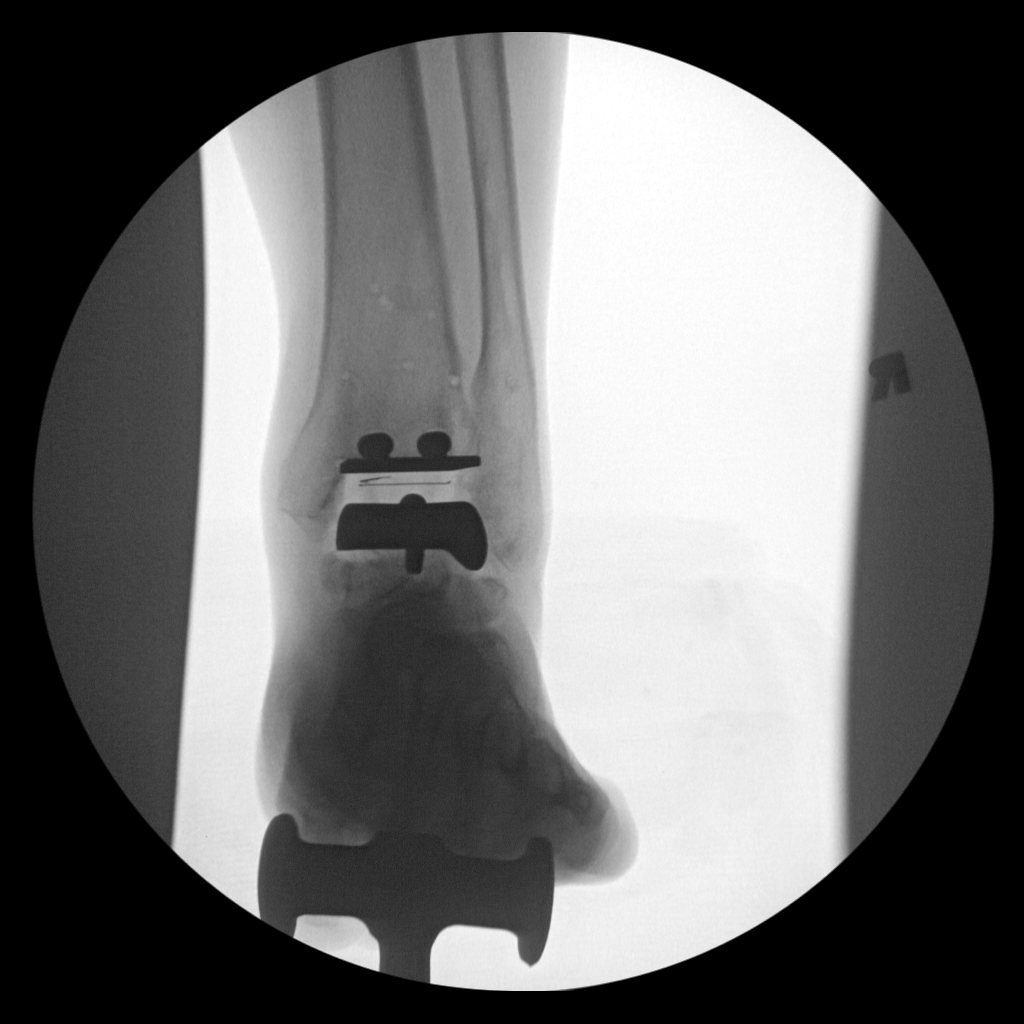

[3 of 3 positions shown; findings below may reference images not displayed]

FINDINGS: The tibiotalar joint arthroplasty is seen in expected position.
Alignment is normal. No evidence of fracture or other complication.
Old distal fibular fracture deformity incidentally noted.
IMPRESSION: Expected postoperative appearance status post tibiotalar
arthroplasty. No acute fracture or other complication identified.

## 2015-11-30 ENCOUNTER — Other Ambulatory Visit: Payer: Self-pay | Admitting: Internal Medicine

## 2015-11-30 DIAGNOSIS — Z1231 Encounter for screening mammogram for malignant neoplasm of breast: Secondary | ICD-10-CM

## 2015-12-14 ENCOUNTER — Ambulatory Visit
Admission: RE | Admit: 2015-12-14 | Discharge: 2015-12-14 | Disposition: A | Discharge status: Home / Self Care | Payer: Medicare Other | Source: Ambulatory Visit | Attending: Internal Medicine | Admitting: Internal Medicine

## 2015-12-14 DIAGNOSIS — Z1231 Encounter for screening mammogram for malignant neoplasm of breast: Secondary | ICD-10-CM

## 2016-11-18 ENCOUNTER — Other Ambulatory Visit: Payer: Self-pay | Admitting: Internal Medicine

## 2016-11-18 DIAGNOSIS — Z1231 Encounter for screening mammogram for malignant neoplasm of breast: Secondary | ICD-10-CM

## 2016-12-16 ENCOUNTER — Ambulatory Visit
Admission: RE | Admit: 2016-12-16 | Discharge: 2016-12-16 | Disposition: A | Discharge status: Home / Self Care | Payer: Medicare Other | Source: Ambulatory Visit | Attending: Internal Medicine | Admitting: Internal Medicine

## 2016-12-16 DIAGNOSIS — Z1231 Encounter for screening mammogram for malignant neoplasm of breast: Secondary | ICD-10-CM

## 2017-11-14 ENCOUNTER — Other Ambulatory Visit: Payer: Self-pay | Admitting: Internal Medicine

## 2017-11-14 DIAGNOSIS — Z1231 Encounter for screening mammogram for malignant neoplasm of breast: Secondary | ICD-10-CM

## 2017-12-22 ENCOUNTER — Ambulatory Visit
Admission: RE | Admit: 2017-12-22 | Discharge: 2017-12-22 | Disposition: A | Discharge status: Home / Self Care | Payer: Medicare Other | Source: Ambulatory Visit | Attending: Internal Medicine | Admitting: Internal Medicine

## 2017-12-22 DIAGNOSIS — Z1231 Encounter for screening mammogram for malignant neoplasm of breast: Secondary | ICD-10-CM

## 2018-11-13 ENCOUNTER — Other Ambulatory Visit: Payer: Self-pay | Admitting: Internal Medicine

## 2018-11-13 DIAGNOSIS — Z1231 Encounter for screening mammogram for malignant neoplasm of breast: Secondary | ICD-10-CM

## 2019-01-01 ENCOUNTER — Ambulatory Visit: Payer: Medicare Other

## 2019-02-14 ENCOUNTER — Ambulatory Visit
Admission: RE | Admit: 2019-02-14 | Discharge: 2019-02-14 | Disposition: A | Discharge status: Home / Self Care | Payer: Medicare Other | Source: Ambulatory Visit | Attending: Internal Medicine | Admitting: Internal Medicine

## 2019-02-14 ENCOUNTER — Other Ambulatory Visit: Payer: Self-pay

## 2019-02-14 DIAGNOSIS — Z1231 Encounter for screening mammogram for malignant neoplasm of breast: Secondary | ICD-10-CM

## 2019-09-11 ENCOUNTER — Ambulatory Visit: Payer: Medicare Other | Admitting: Orthopedic Surgery

## 2019-09-11 ENCOUNTER — Ambulatory Visit: Payer: Self-pay

## 2019-09-11 ENCOUNTER — Encounter: Payer: Self-pay | Admitting: Orthopedic Surgery

## 2019-09-11 DIAGNOSIS — M25561 Pain in right knee: Secondary | ICD-10-CM | POA: Diagnosis not present

## 2019-09-11 DIAGNOSIS — M170 Bilateral primary osteoarthritis of knee: Secondary | ICD-10-CM | POA: Diagnosis not present

## 2019-09-11 DIAGNOSIS — M25562 Pain in left knee: Secondary | ICD-10-CM

## 2019-09-11 MED ORDER — BUPIVACAINE HCL 0.25 % IJ SOLN
4.0000 mL | INTRAMUSCULAR | Status: AC | PRN
  Administered 2019-09-11: 4 mL via INTRA_ARTICULAR

## 2019-09-11 MED ORDER — LIDOCAINE HCL 1 % IJ SOLN
5.0000 mL | INTRAMUSCULAR | Status: AC | PRN
  Administered 2019-09-11: 5 mL

## 2019-09-11 MED ORDER — METHYLPREDNISOLONE ACETATE 40 MG/ML IJ SUSP
40.0000 mg | INTRAMUSCULAR | Status: AC | PRN
  Administered 2019-09-11: 40 mg via INTRA_ARTICULAR

## 2019-09-11 NOTE — Progress Notes (Signed)
Office Visit Note   Patient: Diana Thornton           Date of Birth: 03-Mar-1945           MRN: [id number] Visit Date: 09/11/2019 Requested by: Ralene Ok, MD [address] [place],  [place] [address] PCP: Ralene Ok, MD  Subjective: Chief Complaint  Patient presents with  . Leg Pain    HPI: Lailyn is a patient with bilateral knee pain and leg pain right equal to left.  The pain radiates up and down her legs.  Reports some posterior knee pain with cramps and muscle spasms.  She also reports some burning and a pulling feeling in the legs.  At times is difficult for her to walk.  She had cortisone injection in the left knee 1 week ago done from anterior approach which did not help much.  No history of gout and denies any low back pain.  Now the pain is shifted over to the right knee.  Radiates down to the ankles bilaterally.              ROS: All systems reviewed are negative as they relate to the chief complaint within the history of present illness.  Patient denies  fevers or chills.   Assessment & Plan: Visit Diagnoses:  1. Pain in both knees, unspecified chronicity     Plan: Impression is bilateral knee pain right equal to left.  Mild arthritis is present but there is some warmth and effusion to both knees.  Aspiration injection performed in the left knee today.  Did not inject cortisone but instead Toradol.  Also aspirated and injected the right knee and injected cortisone.  We'll send for crystals.  Did not look too convincing for gout or pseudogout.  A little unclear exactly what the etiology is of her symptoms.  Could be knee related but could also be back related.  Follow-up in 10 days for clinical recheck.  Follow-Up Instructions: No follow-ups on file.   Orders:  Orders Placed This Encounter  Procedures  . XR KNEE 3 VIEW RIGHT  . XR KNEE 3 VIEW LEFT  . Cell count + diff,  w/ cryst-synvl fld   No orders of the defined types were placed in this encounter.      Procedures: Large Joint Inj: bilateral knee on 09/11/2019 10:22 PM Indications: diagnostic evaluation, joint swelling and pain Details: 18 G 1.5 in needle, superolateral approach  Arthrogram: No  Medications (Right): 5 mL lidocaine 1 %; 4 mL bupivacaine 0.25 %; 40 mg methylPREDNISolone acetate 40 MG/ML Medications (Left): 5 mL lidocaine 1 %; 4 mL bupivacaine 0.25 % Outcome: tolerated well, no immediate complications Procedure, treatment alternatives, risks and benefits explained, specific risks discussed. Consent was given by the patient. Immediately prior to procedure a time out was called to verify the correct patient, procedure, equipment, support staff and site/side marked as required. Patient was prepped and draped in the usual sterile fashion.    Right knee injected with cortisone left knee injected with Toradol   Clinical Data: No additional findings.  Objective: Vital Signs: There were no vitals taken for this visit.  Physical Exam:   Constitutional: Patient appears well-developed HEENT:  Head: Normocephalic Eyes:EOM are normal Neck: Normal range of motion Cardiovascular: Normal rate Pulmonary/chest: Effort normal Neurologic: Patient is alert Skin: Skin is warm Psychiatric: Patient has normal mood and affect    Ortho Exam: Ortho exam demonstrates full active and passive range of motion of the hips and  ankles.  Some warmth to the knees with effusion more on the left than the right.  Collateral and cruciate ligaments are stable.  Extensor mechanism is intact bilaterally.  No nerve root tension signs.  Pedal pulses palpable.  Gait is somewhat antalgic.  Specialty Comments:  No specialty comments available.  Imaging: No results found.   PMFS History: Patient Active Problem List   Diagnosis Date Noted  . Post-traumatic arthritis of ankle 02/27/2014  . Palpitations 01/20/2011  . HTN (hypertension) 11/18/2010  . Chest pain 11/18/2010  . Cardiomegaly 11/18/2010    Past Medical History:  Diagnosis Date  . Carpal tunnel syndrome    L hand  . GERD (gastroesophageal reflux disease)    resolved after her brother passed away   . Goiter   . Herpes zoster 2003  . HTN (hypertension)   . Obesity   . Osteoarthritis   . Vitamin D deficiency     No family history on file.  Past Surgical History:  Procedure Laterality Date  . ABDOMINAL HYSTERECTOMY    . BREAST REDUCTION SURGERY    . BREAST SURGERY    . CHOLECYSTECTOMY    . DILATION AND CURETTAGE OF UTERUS    . JOINT REPLACEMENT     Right hip, replacement x2   . REDUCTION MAMMAPLASTY Bilateral   . TOTAL ANKLE ARTHROPLASTY Left 02/27/2014   Procedure: LEFT TOTAL ANKLE ARTHOPLASTY;  Surgeon: Toni Arthurs, MD;  Location: [facility] OR;  Service: Orthopedics;  Laterality: Left;  . TOTAL HIP ARTHROPLASTY    . TUBAL LIGATION     Social History   Occupational History  . Not on file  Tobacco Use  . Smoking status: Never Smoker  Substance and Sexual Activity  . Alcohol use: No  . Drug use: No  . Sexual activity: Not on file

## 2019-09-12 LAB — SYNOVIAL CELL COUNT + DIFF, W/ CRYSTALS
Basophils, %: 0 %
Eosinophils-Synovial: 0 % (ref 0–2)
Lymphocytes-Synovial Fld: 59 % (ref 0–74)
Monocyte/Macrophage: 31 % (ref 0–69)
Neutrophil, Synovial: 10 % (ref 0–24)
Synoviocytes, %: 0 % (ref 0–15)
WBC, Synovial: 595 cells/uL — ABNORMAL HIGH (ref <150)

## 2019-09-12 LAB — TIQ-NTM

## 2019-09-23 ENCOUNTER — Ambulatory Visit: Payer: Medicare Other | Admitting: Surgical

## 2019-09-23 ENCOUNTER — Encounter: Payer: Self-pay | Admitting: Surgical

## 2019-09-23 ENCOUNTER — Other Ambulatory Visit: Payer: Self-pay

## 2019-09-23 DIAGNOSIS — M170 Bilateral primary osteoarthritis of knee: Secondary | ICD-10-CM | POA: Diagnosis not present

## 2019-09-23 NOTE — Progress Notes (Signed)
Office Visit Note   Patient: Diana Thornton           Date of Birth: 10/27/1945           MRN: [id number] Visit Date: 09/23/2019 Requested by: Ralene Ok, MD [address] [place],  [place] [address] PCP: Ralene Ok, MD  Subjective: Chief Complaint  Patient presents with  . Left Knee - Follow-up  . Right Knee - Follow-up    HPI: Diana Thornton is a 74 y.o. female who presents to the office for follow-up of bilateral knee pain. She had insidious onset without injury of bilateral knee pain in early August. She was ambulating with a walker due to her pain. Following left knee Toradol injection in right knee cortisone injection she is doing significantly better. She notes 90% relief. She is not using any form of DME to ambulate now. She notes only some soreness in the posterior left knee. She denies any groin pain, mechanical symptoms of the knees, instability..                ROS: All systems reviewed are negative as they relate to the chief complaint within the history of present illness.  Patient denies fevers or chills.  Assessment & Plan: Visit Diagnoses: No diagnosis found.  Plan: Patient is a 74 year old female presents for 10-day follow-up of bilateral knee pain. She has had 90% relief with injections. Aspirate analysis showed W BC of 595 and no crystals were present. With significant improvement of patient's pain to the point that she is not using any ambulatory DME, plan for patient to follow-up as needed. Patient agreed with this plan.  Follow-Up Instructions: No follow-ups on file.   Orders:  No orders of the defined types were placed in this encounter.  No orders of the defined types were placed in this encounter.     Procedures: No procedures performed   Clinical Data: No additional findings.  Objective: Vital Signs: There were no vitals taken for this visit.  Physical Exam:  Constitutional: Patient appears well-developed HEENT:  Head:  Normocephalic Eyes:EOM are normal Neck: Normal range of motion Cardiovascular: Normal rate Pulmonary/chest: Effort normal Neurologic: Patient is alert Skin: Skin is warm Psychiatric: Patient has normal mood and affect  Ortho Exam: Ortho exam demonstrates bilateral knees without effusion. Extensor mechanism is intact. No tenderness throughout bilateral knees aside from some soreness in the posterior left knee. Small Baker's cyst of the left knee. No anterior laxity noted.  Specialty Comments:  No specialty comments available.  Imaging: No results found.   PMFS History: Patient Active Problem List   Diagnosis Date Noted  . Post-traumatic arthritis of ankle 02/27/2014  . Palpitations 01/20/2011  . HTN (hypertension) 11/18/2010  . Chest pain 11/18/2010  . Cardiomegaly 11/18/2010   Past Medical History:  Diagnosis Date  . Carpal tunnel syndrome    L hand  . GERD (gastroesophageal reflux disease)    resolved after her brother passed away   . Goiter   . Herpes zoster 2003  . HTN (hypertension)   . Obesity   . Osteoarthritis   . Vitamin D deficiency     No family history on file.  Past Surgical History:  Procedure Laterality Date  . ABDOMINAL HYSTERECTOMY    . BREAST REDUCTION SURGERY    . BREAST SURGERY    . CHOLECYSTECTOMY    . DILATION AND CURETTAGE OF UTERUS    . JOINT REPLACEMENT     Right hip, replacement x2   .  REDUCTION MAMMAPLASTY Bilateral   . TOTAL ANKLE ARTHROPLASTY Left 02/27/2014   Procedure: LEFT TOTAL ANKLE ARTHOPLASTY;  Surgeon: Toni Arthurs, MD;  Location: [facility] OR;  Service: Orthopedics;  Laterality: Left;  . TOTAL HIP ARTHROPLASTY    . TUBAL LIGATION     Social History   Occupational History  . Not on file  Tobacco Use  . Smoking status: Never Smoker  Substance and Sexual Activity  . Alcohol use: No  . Drug use: No  . Sexual activity: Not on file

## 2019-10-30 ENCOUNTER — Ambulatory Visit (INDEPENDENT_AMBULATORY_CARE_PROVIDER_SITE_OTHER): Payer: Medicare Other | Admitting: Orthopedic Surgery

## 2019-10-30 DIAGNOSIS — M170 Bilateral primary osteoarthritis of knee: Secondary | ICD-10-CM

## 2019-10-30 MED ORDER — GABAPENTIN 100 MG PO CAPS: ORAL_CAPSULE | ORAL | 0 refills | Status: DC

## 2019-11-03 ENCOUNTER — Encounter: Payer: Self-pay | Admitting: Orthopedic Surgery

## 2019-11-03 NOTE — Progress Notes (Signed)
Office Visit Note   Patient: Diana Thornton           Date of Birth: Nov 11, 1945           MRN: [id number] Visit Date: 10/30/2019 Requested by: Ralene Ok, MD [address] [place],  [place] [address] PCP: Ralene Ok, MD  Subjective: Chief Complaint  Patient presents with   Right Knee - Pain    HPI: [name] is a 74 year old patient here for follow-up of her right leg knee.  Reports continued pain with a lot of medial pain.  She has good and bad days.  Uses a cane to weight-bear.  She is taking Tylenol.  She does describe a twisting injury recently.  She did have some injections which gave her pretty good relief in August.  The little soon for another injection.              ROS: All systems reviewed are negative as they relate to the chief complaint within the history of present illness.  Patient denies  fevers or chills.   Assessment & Plan: Visit Diagnoses:  1. Primary osteoarthritis of both knees     Plan: Impression is flareup of arthritis in the right knee following a twisting injury.  It has gotten better the last 2 days on Tylenol.  She also wants to try Neurontin for this which we will prescribe even though is off label and that is discussed with the patient.  We will inject her knee in 2 weeks if it is not any better.  Follow-up as needed.  Follow-Up Instructions: Return if symptoms worsen or fail to improve.   Orders:  No orders of the defined types were placed in this encounter.  Meds ordered this encounter  Medications   gabapentin (NEURONTIN) 100 MG capsule    Sig: 1 po bid prn pain    Dispense:  30 capsule    Refill:  0      Procedures: No procedures performed   Clinical Data: No additional findings.  Objective: Vital Signs: There were no vitals taken for this visit.  Physical Exam:   Constitutional: Patient appears well-developed HEENT:  Head: Normocephalic Eyes:EOM are normal Neck: Normal range of motion Cardiovascular: Normal  rate Pulmonary/chest: Effort normal Neurologic: Patient is alert Skin: Skin is warm Psychiatric: Patient has normal mood and affect    Ortho Exam: Ortho exam demonstrates full active and passive range of motion of the right knee.  No effusion.  Collateral crucial ligaments are stable.  Does have medial greater than lateral joint line tenderness.  No groin pain with internal X rotation leg.  Slightly antalgic gait to the right.  Pedal pulse palpable.  Ankle dorsiflexion intact.  Specialty Comments:  No specialty comments available.  Imaging: No results found.   PMFS History: Patient Active Problem List   Diagnosis Date Noted   Post-traumatic arthritis of ankle 02/27/2014   Palpitations 01/20/2011   HTN (hypertension) 11/18/2010   Chest pain 11/18/2010   Cardiomegaly 11/18/2010   Past Medical History:  Diagnosis Date   Carpal tunnel syndrome    L hand   GERD (gastroesophageal reflux disease)    resolved after her brother passed away    Goiter    Herpes zoster 2003   HTN (hypertension)    Obesity    Osteoarthritis    Vitamin D deficiency     History reviewed. No pertinent family history.  Past Surgical History:  Procedure Laterality Date   ABDOMINAL HYSTERECTOMY  BREAST REDUCTION SURGERY     BREAST SURGERY     CHOLECYSTECTOMY     DILATION AND CURETTAGE OF UTERUS     JOINT REPLACEMENT     Right hip, replacement x2    REDUCTION MAMMAPLASTY Bilateral    TOTAL ANKLE ARTHROPLASTY Left 02/27/2014   Procedure: LEFT TOTAL ANKLE ARTHOPLASTY;  Surgeon: Toni Arthurs, MD;  Location: [facility] OR;  Service: Orthopedics;  Laterality: Left;   TOTAL HIP ARTHROPLASTY     TUBAL LIGATION     Social History   Occupational History   Not on file  Tobacco Use   Smoking status: Never Smoker  Substance and Sexual Activity   Alcohol use: No   Drug use: No   Sexual activity: Not on file

## 2019-11-11 ENCOUNTER — Ambulatory Visit: Payer: Medicare Other | Attending: Internal Medicine

## 2019-11-11 DIAGNOSIS — Z23 Encounter for immunization: Secondary | ICD-10-CM

## 2019-11-11 NOTE — Progress Notes (Signed)
   Covid-19 Vaccination Clinic  Name:  Diana Thornton    MRN: [id number] DOB: July 31, 1945  11/11/2019  Diana Thornton was observed post Covid-19 immunization for 15 minutes without incident. She was provided with Vaccine Information Sheet and instruction to access the V-Safe system.   Diana Thornton was instructed to call 911 with any severe reactions post vaccine: Marland Kitchen Difficulty breathing  . Swelling of face and throat  . A fast heartbeat  . A bad rash all over body  . Dizziness and weakness

## 2020-03-19 ENCOUNTER — Other Ambulatory Visit: Payer: Self-pay | Admitting: Internal Medicine

## 2020-03-19 DIAGNOSIS — Z1231 Encounter for screening mammogram for malignant neoplasm of breast: Secondary | ICD-10-CM

## 2020-03-21 ENCOUNTER — Ambulatory Visit
Admission: RE | Admit: 2020-03-21 | Discharge: 2020-03-21 | Disposition: A | Discharge status: Home / Self Care | Payer: Medicare Other | Source: Ambulatory Visit | Attending: Internal Medicine | Admitting: Internal Medicine

## 2020-03-21 ENCOUNTER — Other Ambulatory Visit: Payer: Self-pay

## 2020-03-21 DIAGNOSIS — Z1231 Encounter for screening mammogram for malignant neoplasm of breast: Secondary | ICD-10-CM

## 2021-02-18 ENCOUNTER — Other Ambulatory Visit: Payer: Self-pay | Admitting: Internal Medicine

## 2021-02-18 DIAGNOSIS — Z1231 Encounter for screening mammogram for malignant neoplasm of breast: Secondary | ICD-10-CM

## 2021-03-23 ENCOUNTER — Ambulatory Visit
Admission: RE | Admit: 2021-03-23 | Discharge: 2021-03-23 | Disposition: A | Discharge status: Home / Self Care | Payer: Medicare Other | Source: Ambulatory Visit | Attending: Internal Medicine | Admitting: Internal Medicine

## 2021-03-23 DIAGNOSIS — Z1231 Encounter for screening mammogram for malignant neoplasm of breast: Secondary | ICD-10-CM

## 2021-03-24 ENCOUNTER — Other Ambulatory Visit: Payer: Self-pay | Admitting: Internal Medicine

## 2021-03-24 DIAGNOSIS — R928 Other abnormal and inconclusive findings on diagnostic imaging of breast: Secondary | ICD-10-CM

## 2021-04-05 ENCOUNTER — Ambulatory Visit
Admission: RE | Admit: 2021-04-05 | Discharge: 2021-04-05 | Disposition: A | Discharge status: Home / Self Care | Payer: Medicare Other | Source: Ambulatory Visit | Attending: Internal Medicine | Admitting: Internal Medicine

## 2021-04-05 DIAGNOSIS — R928 Other abnormal and inconclusive findings on diagnostic imaging of breast: Secondary | ICD-10-CM

## 2021-08-02 ENCOUNTER — Ambulatory Visit: Payer: Medicare Other | Admitting: Orthopedic Surgery

## 2021-08-02 ENCOUNTER — Ambulatory Visit (INDEPENDENT_AMBULATORY_CARE_PROVIDER_SITE_OTHER): Payer: Medicare Other

## 2021-08-02 ENCOUNTER — Encounter: Payer: Self-pay | Admitting: Orthopedic Surgery

## 2021-08-02 DIAGNOSIS — M5441 Lumbago with sciatica, right side: Secondary | ICD-10-CM

## 2021-08-02 DIAGNOSIS — M5442 Lumbago with sciatica, left side: Secondary | ICD-10-CM

## 2021-08-02 DIAGNOSIS — G8929 Other chronic pain: Secondary | ICD-10-CM

## 2021-08-07 NOTE — Progress Notes (Signed)
Office Visit Note   Patient: Diana Thornton           Date of Birth: Nov 30, 1945           MRN: [id number] Visit Date: 08/02/2021 Requested by: Ralene Ok, MD [address] [place],  [place] [address] PCP: Ralene Ok, MD  Subjective: Chief Complaint  Patient presents with   Lower Back - Pain    HPI: Tannia is a 76 year old patient with low back pain on both sides.  She had a fall in December 2022 trying to hang curtains.  Reports some radiating pain as well as numbness and tingling.  The pain does not wake her from sleep at night.  Denies any groin pain.  She does have a right hip replacement.  Describes some buttock pain as well as trochanteric pain.  Denies any fevers or chills.  Saw her primary care provider who gave her muscle relaxer without much relief.  At times it is difficult for her to walk.  States the pain is constant but comes and goes in severity.  Also using Aspercreme and Tylenol.  Hard for her to turn over at night.  Hard for her to work in the garden.  Occasional radiation to the right leg occurs.  Symptoms are getting worse.  In the past with her right hip which required revision her pain tolerance was demonstrated to be on the high side and thus her reports of back pain are very credible.              ROS: All systems reviewed are negative as they relate to the chief complaint within the history of present illness.  Patient denies  fevers or chills.   Assessment & Plan: Visit Diagnoses:  1. Chronic bilateral low back pain with bilateral sciatica     Plan: Impression is low back pain with some radiation which has been going on since a fall in December 2022.  No acute fractures present but moderate degenerative changes are present throughout the lumbar spine.  I do not think this is necessarily an operative problem but it is a problem which could be helped with epidural steroid injections and to that end we will order MRI scan of the lumbar spine to evaluate  stenosis and follow-up after that study.  Follow-Up Instructions: Return for after MRI.   Orders:  Orders Placed This Encounter  Procedures   XR Lumbar Spine 2-3 Views   MR Lumbar Spine w/o contrast   No orders of the defined types were placed in this encounter.     Procedures: No procedures performed   Clinical Data: No additional findings.  Objective: Vital Signs: There were no vitals taken for this visit.  Physical Exam:   Constitutional: Patient appears well-developed HEENT:  Head: Normocephalic Eyes:EOM are normal Neck: Normal range of motion Cardiovascular: Normal rate Pulmonary/chest: Effort normal Neurologic: Patient is alert Skin: Skin is warm Psychiatric: Patient has normal mood and affect   Ortho Exam: Ortho exam demonstrates full active and passive range of motion of the knees and ankles.  No groin pain on the right left-hand side with internal or external rotation of the leg.  5 out of 5 ankle dorsiflexion plantarflexion quad hamstring strength with symmetric reflexes 0 to 1+ out of 4 bilateral patella and Achilles.  No paresthesias L1-S1 bilaterally.  Mild pain with forward lateral bending.  Slightly more trochanteric tenderness on the right compared to the left.  Hip flexion strength 5+ out of 5 bilaterally.  Specialty Comments:  No specialty comments available.  Imaging: No results found.   PMFS History: Patient Active Problem List   Diagnosis Date Noted   Post-traumatic arthritis of ankle 02/27/2014   Palpitations 01/20/2011   HTN (hypertension) 11/18/2010   Chest pain 11/18/2010   Cardiomegaly 11/18/2010   Past Medical History:  Diagnosis Date   Carpal tunnel syndrome    L hand   GERD (gastroesophageal reflux disease)    resolved after her brother passed away    Goiter    Herpes zoster 2003   HTN (hypertension)    Obesity    Osteoarthritis    Vitamin D deficiency     History reviewed. No pertinent family history.  Past Surgical  History:  Procedure Laterality Date   ABDOMINAL HYSTERECTOMY     BREAST REDUCTION SURGERY     BREAST SURGERY     CHOLECYSTECTOMY     DILATION AND CURETTAGE OF UTERUS     JOINT REPLACEMENT     Right hip, replacement x2    REDUCTION MAMMAPLASTY Bilateral    TOTAL ANKLE ARTHROPLASTY Left 02/27/2014   Procedure: LEFT TOTAL ANKLE ARTHOPLASTY;  Surgeon: Toni Arthurs, MD;  Location: [facility] OR;  Service: Orthopedics;  Laterality: Left;   TOTAL HIP ARTHROPLASTY     TUBAL LIGATION     Social History   Occupational History   Not on file  Tobacco Use   Smoking status: Never   Smokeless tobacco: Not on file  Substance and Sexual Activity   Alcohol use: No   Drug use: No   Sexual activity: Not on file

## 2021-08-13 ENCOUNTER — Ambulatory Visit
Admission: RE | Admit: 2021-08-13 | Discharge: 2021-08-13 | Disposition: A | Discharge status: Home / Self Care | Payer: Medicare Other | Source: Ambulatory Visit | Attending: Orthopedic Surgery | Admitting: Orthopedic Surgery

## 2021-08-13 DIAGNOSIS — G8929 Other chronic pain: Secondary | ICD-10-CM

## 2021-08-25 ENCOUNTER — Ambulatory Visit: Payer: Medicare Other | Admitting: Orthopedic Surgery

## 2021-08-25 ENCOUNTER — Ambulatory Visit (INDEPENDENT_AMBULATORY_CARE_PROVIDER_SITE_OTHER): Payer: Medicare Other

## 2021-08-25 DIAGNOSIS — M5416 Radiculopathy, lumbar region: Secondary | ICD-10-CM

## 2021-08-25 DIAGNOSIS — M25551 Pain in right hip: Secondary | ICD-10-CM

## 2021-08-25 DIAGNOSIS — M170 Bilateral primary osteoarthritis of knee: Secondary | ICD-10-CM

## 2021-08-27 IMAGING — MG DIGITAL SCREENING BILAT W/ CAD
4 series · 4 of 4 positions shown · non-contrast
Comparison: Previous exam(s).

ACR Breast Density Category a: The breast tissue is almost entirely
fatty.

CLINICAL DATA: Screening.

EXAM:
DIGITAL SCREENING BILATERAL MAMMOGRAM WITH CAD
TECHNIQUE: Bilateral screening digital craniocaudal and mediolateral oblique
mammograms were obtained. The images were evaluated with
computer-aided detection.

[L MLO]
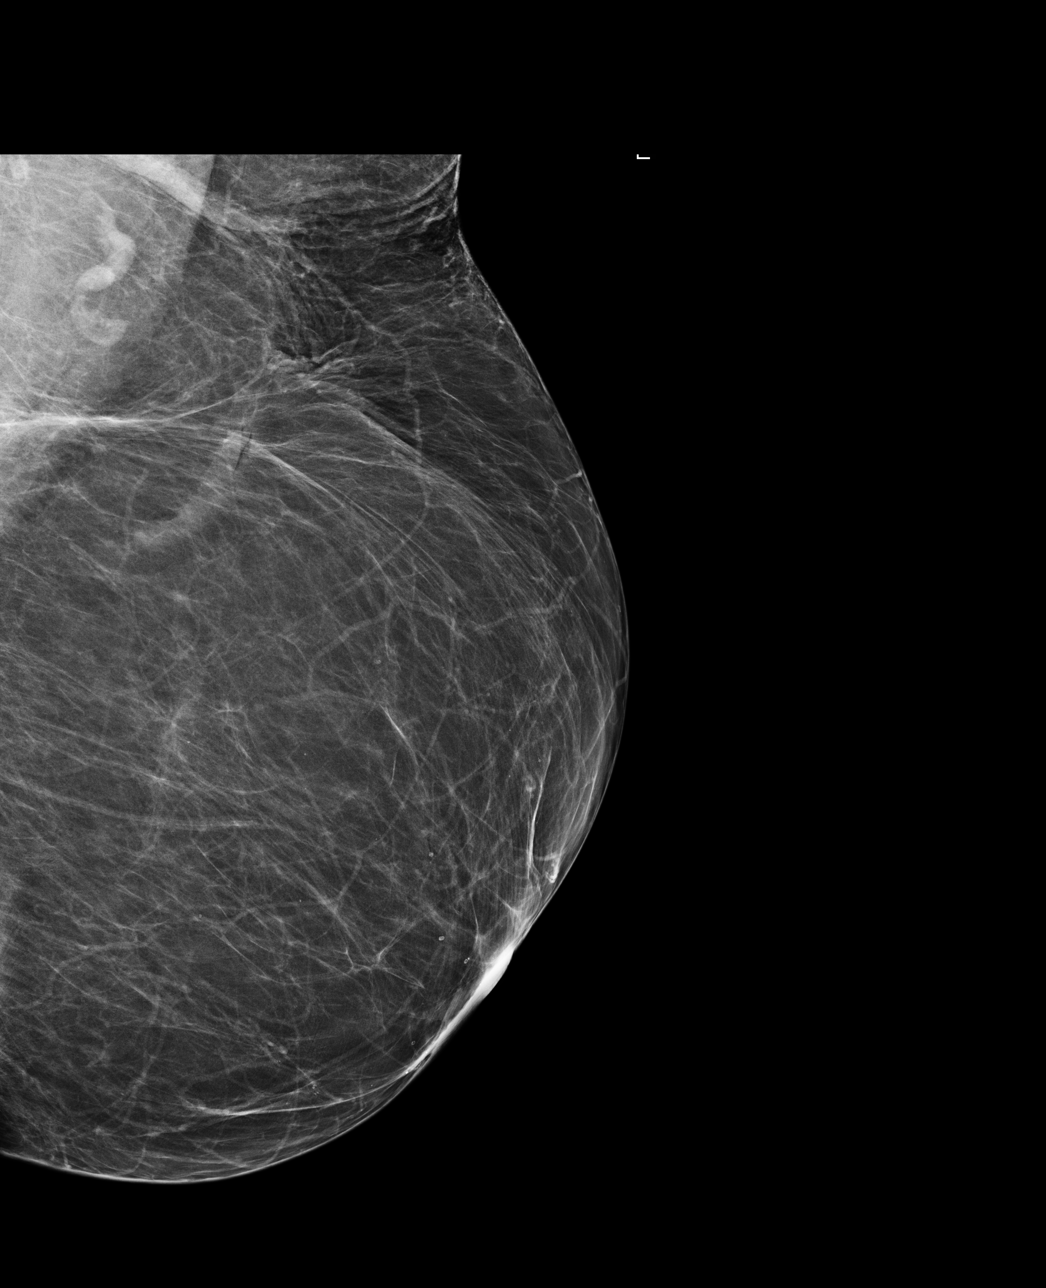

[L CC]
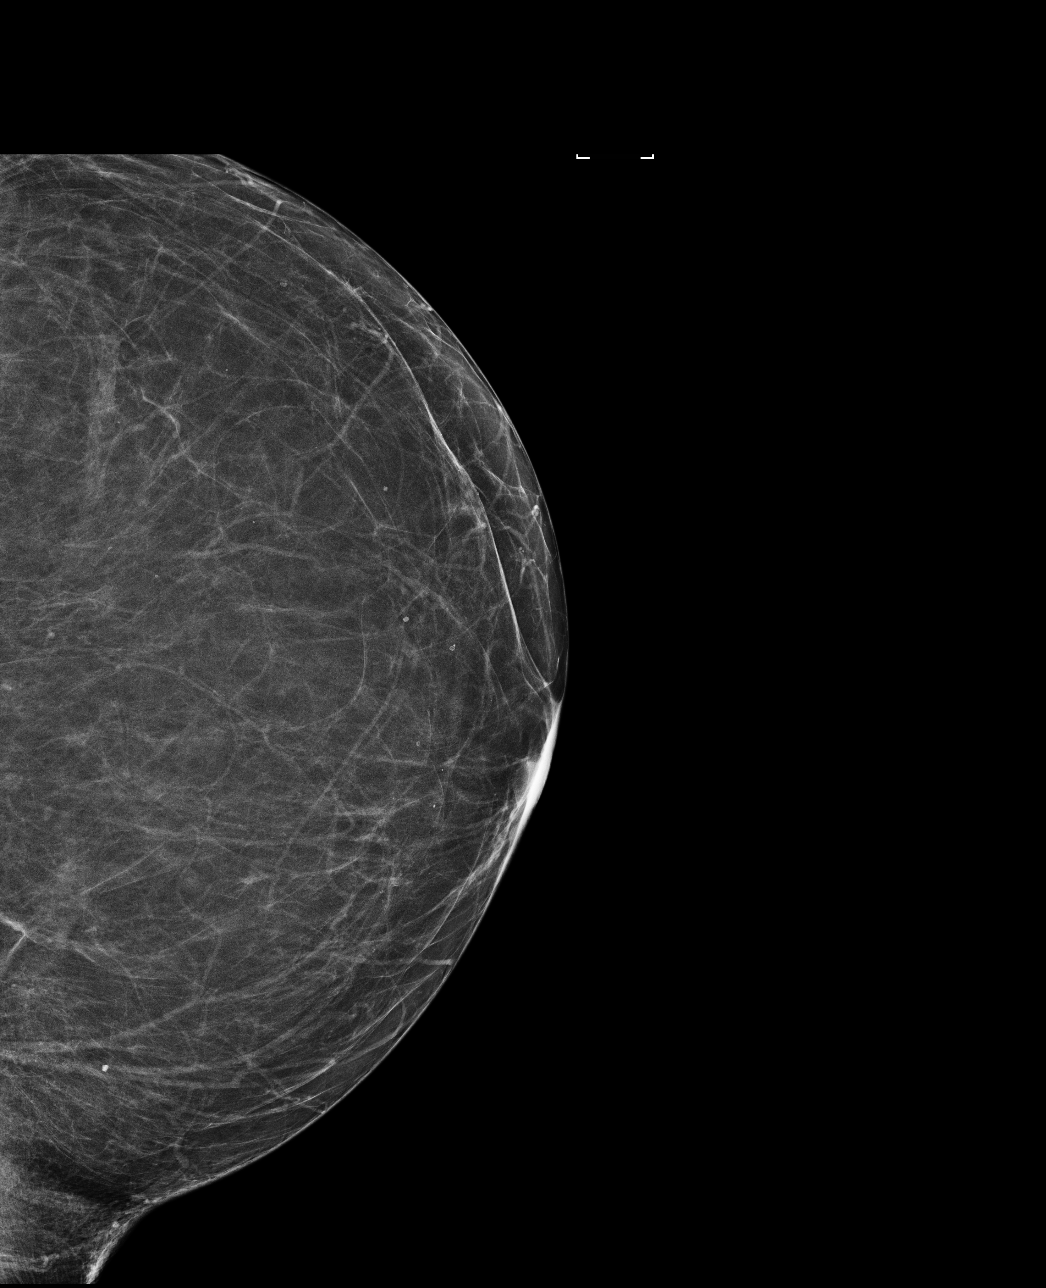

[R CC]
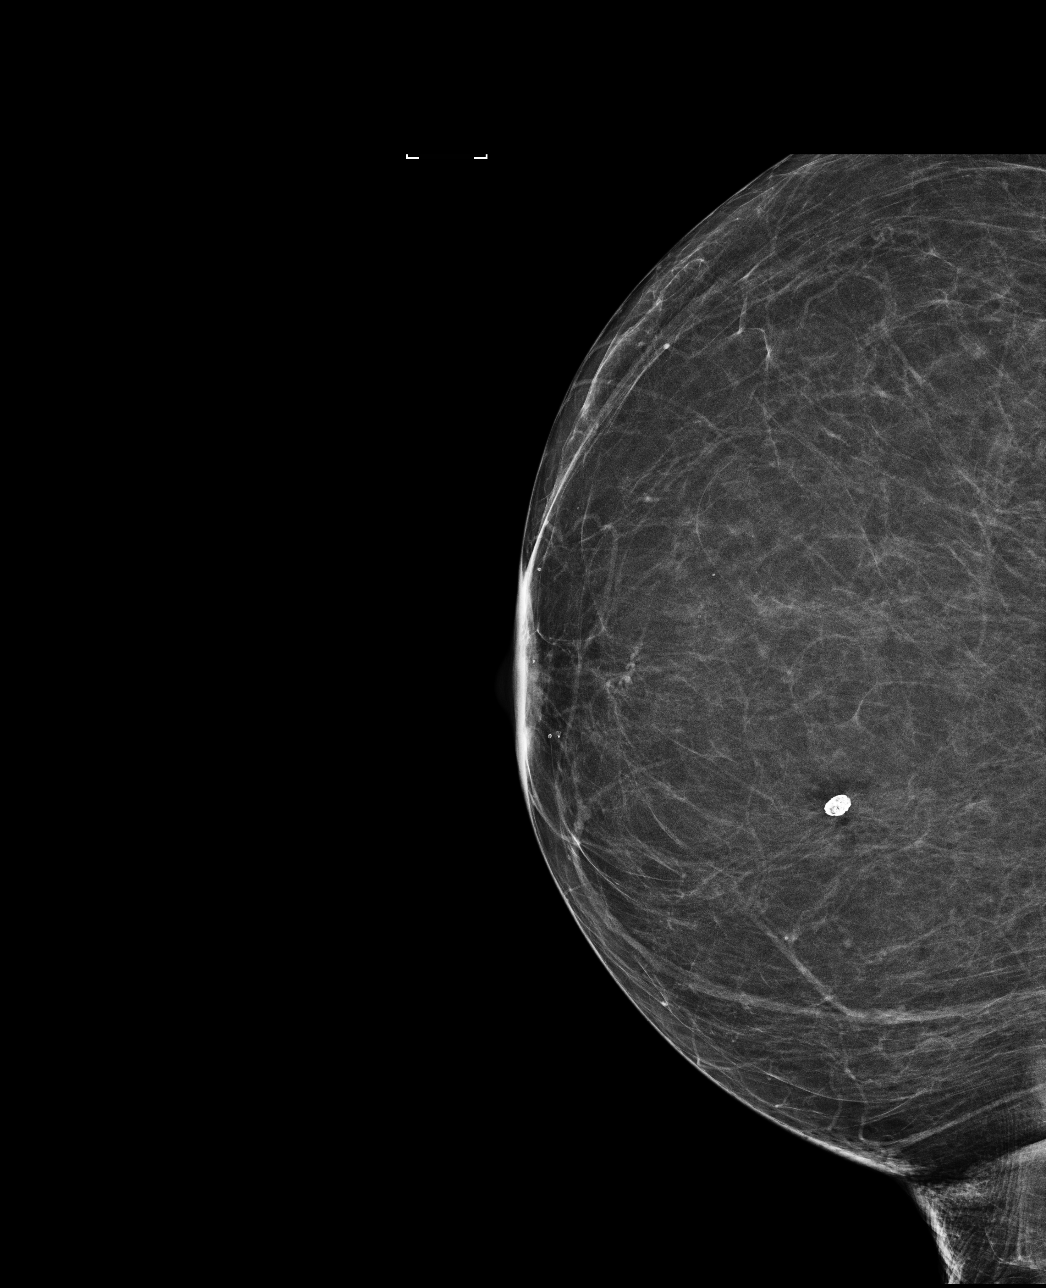

[R MLO]
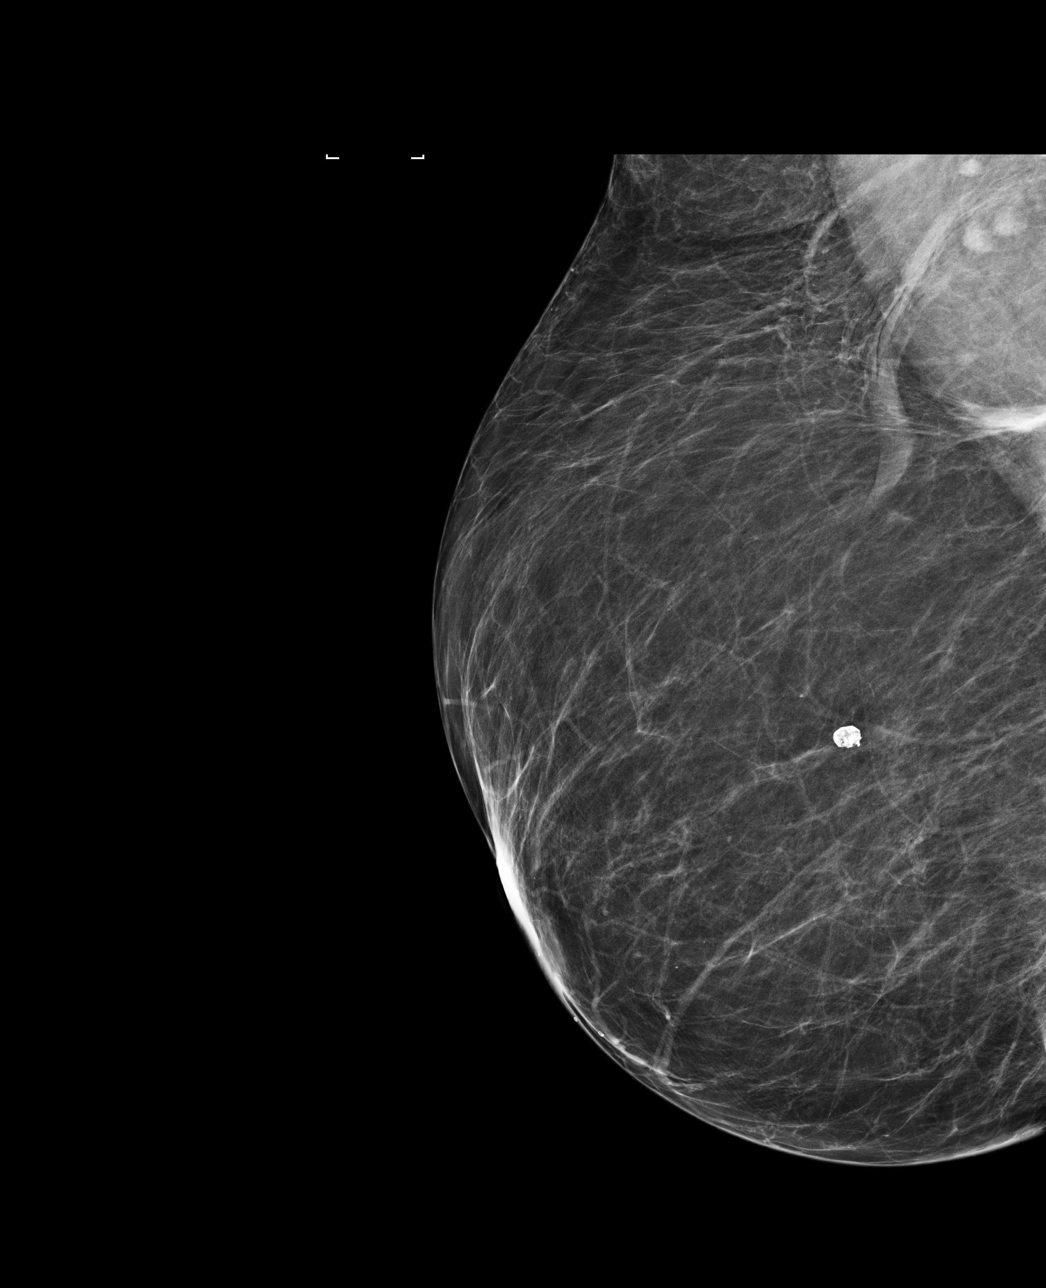

[4 of 4 positions shown; findings below may reference images not displayed]

FINDINGS: There are no findings suspicious for malignancy.
IMPRESSION: No mammographic evidence of malignancy. A result letter of this
screening mammogram will be mailed directly to the patient.

RECOMMENDATION:
Screening mammogram in one year. (Code:9G-A-2OA)

BI-RADS CATEGORY  1: Negative.

## 2021-08-28 ENCOUNTER — Encounter: Payer: Self-pay | Admitting: Orthopedic Surgery

## 2021-08-28 NOTE — Progress Notes (Signed)
Office Visit Note   Patient: Diana Thornton           Date of Birth: [birth year]           MRN: [id number] Visit Date: 08/25/2021 Requested by: Ralene Ok, MD [address] [place],  [place] [address] PCP: Ralene Ok, MD  Subjective: Chief Complaint  Patient presents with   Other    Scan review    HPI: Diana Thornton is a 76 year old patient with low back pain radiating down the right leg.  She feels like her right hip may be "slipping" which started a month ago.  She has had the right hip revised from metal-on-metal to S-ROM by Dr. Turner Daniels.  She reports low back pain radiating down the right leg occasionally to the knee.  Denies any groin pain.  Hard for her to work in the garden.  Typically she does not come in for orthopedic issues unless she has significant problem.  She did have a fall in December 2022.  She has had an MRI scan of the lumbar spine which is reviewed today.  It does show severe bilateral facet arthropathy at L3-4 with moderate severe spinal stenosis at this level.  No acute osseous injury to the lumbar spine.              ROS: All systems reviewed are negative as they relate to the chief complaint within the history of present illness.  Patient denies  fevers or chills.   Assessment & Plan: Visit Diagnoses:  1. Primary osteoarthritis of both knees   2. Pain in right hip   3. Radiculopathy, lumbar region     Plan: Impression is low back pain radiating down the right-hand side.  This is likely from her back.  Radiographs of the hip today look intact.  Would like to send her to Dr. Alvester Morin for L-spine ESI to evaluate right-sided radiculopathy.  May need back surgery if this does not help.  Follow-Up Instructions: No follow-ups on file.   Orders:  Orders Placed This Encounter  Procedures   XR HIP UNILAT W OR W/O PELVIS 2-3 VIEWS RIGHT   Ambulatory referral to Physical Medicine Rehab   No orders of the defined types were placed in this encounter.      Procedures: No procedures performed   Clinical Data: No additional findings.  Objective: Vital Signs: There were no vitals taken for this visit.  Physical Exam:   Constitutional: Patient appears well-developed HEENT:  Head: Normocephalic Eyes:EOM are normal Neck: Normal range of motion Cardiovascular: Normal rate Pulmonary/chest: Effort normal Neurologic: Patient is alert Skin: Skin is warm Psychiatric: Patient has normal mood and affect   Ortho Exam: Ortho exam demonstrates full active and passive range of motion of the knees and ankles.  No nerve root tension signs.  No definite paresthesias L1 S1 bilaterally.  Reflex symmetric 0 to 1+ out of 4 bilateral patella and Achilles.  Pedal pulses palpable.  Gait is nonantalgic and not Trendelenburg.  No groin pain on the right or left-hand side with internal/external rotation of the leg.  No muscle atrophy right leg versus left leg.  No pain with forward lateral bending.  Specialty Comments:  No specialty comments available.  Imaging: No results found.   PMFS History: Patient Active Problem List   Diagnosis Date Noted   Post-traumatic arthritis of ankle 02/27/2014   Palpitations 01/20/2011   HTN (hypertension) 11/18/2010   Chest pain 11/18/2010   Cardiomegaly 11/18/2010   Past Medical History:  Diagnosis Date   Carpal tunnel syndrome    L hand   GERD (gastroesophageal reflux disease)    resolved after her brother passed away    Goiter    Herpes zoster 2003   HTN (hypertension)    Obesity    Osteoarthritis    Vitamin D deficiency     No family history on file.  Past Surgical History:  Procedure Laterality Date   ABDOMINAL HYSTERECTOMY     BREAST REDUCTION SURGERY     BREAST SURGERY     CHOLECYSTECTOMY     DILATION AND CURETTAGE OF UTERUS     JOINT REPLACEMENT     Right hip, replacement x2    REDUCTION MAMMAPLASTY Bilateral    TOTAL ANKLE ARTHROPLASTY Left 02/27/2014   Procedure: LEFT TOTAL ANKLE  ARTHOPLASTY;  Surgeon: Toni Arthurs, MD;  Location: [facility] OR;  Service: Orthopedics;  Laterality: Left;   TOTAL HIP ARTHROPLASTY     TUBAL LIGATION     Social History   Occupational History   Not on file  Tobacco Use   Smoking status: Never   Smokeless tobacco: Not on file  Substance and Sexual Activity   Alcohol use: No   Drug use: No   Sexual activity: Not on file

## 2021-09-15 ENCOUNTER — Encounter: Payer: Self-pay | Admitting: Physical Medicine and Rehabilitation

## 2021-09-15 ENCOUNTER — Ambulatory Visit: Payer: Medicare Other | Admitting: Physical Medicine and Rehabilitation

## 2021-09-15 ENCOUNTER — Ambulatory Visit: Payer: Self-pay

## 2021-09-15 VITALS — BP 169/90 | HR 101

## 2021-09-15 DIAGNOSIS — M48062 Spinal stenosis, lumbar region with neurogenic claudication: Secondary | ICD-10-CM | POA: Diagnosis not present

## 2021-09-15 DIAGNOSIS — M5416 Radiculopathy, lumbar region: Secondary | ICD-10-CM

## 2021-09-15 MED ORDER — DEXAMETHASONE SODIUM PHOSPHATE 10 MG/ML IJ SOLN
10.0000 mg | Freq: Once | INTRAMUSCULAR | Status: AC
  Administered 2021-09-15: 10 mg

## 2021-09-15 NOTE — Progress Notes (Signed)
Pt state lower back pain that travels down her right leg. Pt state walking, standing and movement makes the pain worse. Pt state she takes over the counter pain meds to help ease her pain.  Numeric Pain Rating Scale and Functional Assessment Average Pain 7   In the last MONTH (on 0-10 scale) has pain interfered with the following?  1. General activity like being  able to carry out your everyday physical activities such as walking, climbing stairs, carrying groceries, or moving a chair?  Rating(10)   +Driver, -BT, -Dye Allergies.

## 2021-09-15 NOTE — Patient Instructions (Signed)

## 2021-09-29 NOTE — Progress Notes (Signed)
Diana Thornton - 76 y.o. female MRN [id number]  Date of birth: May 11, 1945  Office Visit Note: Visit Date: 09/15/2021 PCP: Ralene Ok, MD Referred by: Ralene Ok, MD  Subjective: Chief Complaint  Patient presents with   Lower Back - Pain   Right Leg - Pain   HPI:  Diana Thornton is a 76 y.o. female who comes in today at the request of Dr. Burnard Bunting for planned Bilateral L3-4 Lumbar Transforaminal epidural steroid injection with fluoroscopic guidance.  The patient has failed conservative care including home exercise, medications, time and activity modification.  This injection will be diagnostic and hopefully therapeutic.  Please see requesting physician notes for further details and justification.   ROS Otherwise per HPI.  Assessment & Plan: Visit Diagnoses:    ICD-10-CM   1. Lumbar radiculopathy  M54.16 XR C-ARM NO REPORT    Epidural Steroid injection    dexamethasone (DECADRON) injection 10 mg    2. Spinal stenosis of lumbar region with neurogenic claudication  M48.062 XR C-ARM NO REPORT    Epidural Steroid injection    dexamethasone (DECADRON) injection 10 mg      Plan: No additional findings.   Meds & Orders:  Meds ordered this encounter  Medications   dexamethasone (DECADRON) injection 10 mg    Orders Placed This Encounter  Procedures   XR C-ARM NO REPORT   Epidural Steroid injection    Follow-up: No follow-ups on file.   Procedures: No procedures performed  Lumbosacral Transforaminal Epidural Steroid Injection - Sub-Pedicular Approach with Fluoroscopic Guidance  Patient: Diana Thornton      Date of Birth: 1945/12/11 MRN: [id number] PCP: Ralene Ok, MD      Visit Date: 09/15/2021   Universal Protocol:    Date/Time: 09/15/2021  Consent Given By: the patient  Position: PRONE  Additional Comments: Vital signs were monitored before and after the procedure. Patient was prepped and draped in the usual sterile fashion. The correct  patient, procedure, and site was verified.   Injection Procedure Details:   Procedure diagnoses: Lumbar radiculopathy [M54.16]    Meds Administered:  Meds ordered this encounter  Medications   dexamethasone (DECADRON) injection 10 mg    Laterality: Bilateral  Location/Site: L3  Needle:5.0 in., 22 ga.  Short bevel or Quincke spinal needle  Needle Placement: Transforaminal  Findings:    -Comments: Excellent flow of contrast along the nerve, nerve root and into the epidural space.  Procedure Details: After squaring off the end-plates to get a true AP view, the C-arm was positioned so that an oblique view of the foramen as noted above was visualized. The target area is just inferior to the "nose of the scotty dog" or sub pedicular. The soft tissues overlying this structure were infiltrated with 2-3 ml. of 1% Lidocaine without Epinephrine.  The spinal needle was inserted toward the target using a "trajectory" view along the fluoroscope beam.  Under AP and lateral visualization, the needle was advanced so it did not puncture dura and was located close the 6 O'Clock position of the pedical in AP tracterory. Biplanar projections were used to confirm position. Aspiration was confirmed to be negative for CSF and/or blood. A 1-2 ml. volume of Isovue-250 was injected and flow of contrast was noted at each level. Radiographs were obtained for documentation purposes.   After attaining the desired flow of contrast documented above, a 0.5 to 1.0 ml test dose of 0.25% Marcaine was injected into each respective transforaminal space.  The patient was  observed for 90 seconds post injection.  After no sensory deficits were reported, and normal lower extremity motor function was noted,   the above injectate was administered so that equal amounts of the injectate were placed at each foramen (level) into the transforaminal epidural space.   Additional Comments:  The patient tolerated the procedure  well Dressing: 2 x 2 sterile gauze and Band-Aid    Post-procedure details: Patient was observed during the procedure. Post-procedure instructions were reviewed.  Patient left the clinic in stable condition.    Clinical History: No specialty comments available.     Objective:  VS:  HT:    WT:   BMI:     BP:(!) 169/90  HR:(!) 101bpm  TEMP: ( )  RESP:  Physical Exam Vitals and nursing note reviewed.  Constitutional:      General: She is not in acute distress.    Appearance: Normal appearance. She is not ill-appearing.  HENT:     Head: Normocephalic and atraumatic.     Right Ear: External ear normal.     Left Ear: External ear normal.  Eyes:     Extraocular Movements: Extraocular movements intact.  Cardiovascular:     Rate and Rhythm: Normal rate.     Pulses: Normal pulses.  Pulmonary:     Effort: Pulmonary effort is normal. No respiratory distress.  Abdominal:     General: There is no distension.     Palpations: Abdomen is soft.  Musculoskeletal:        General: Tenderness present.     Cervical back: Neck supple.     Right lower leg: No edema.     Left lower leg: No edema.     Comments: Patient has good distal strength with no pain over the greater trochanters.  No clonus or focal weakness.  Skin:    Findings: No erythema, lesion or rash.  Neurological:     General: No focal deficit present.     Mental Status: She is alert and oriented to person, place, and time.     Sensory: No sensory deficit.     Motor: No weakness or abnormal muscle tone.     Coordination: Coordination normal.  Psychiatric:        Mood and Affect: Mood normal.        Behavior: Behavior normal.      Imaging: No results found.

## 2021-09-29 NOTE — Procedures (Signed)
Lumbosacral Transforaminal Epidural Steroid Injection - Sub-Pedicular Approach with Fluoroscopic Guidance  Patient: Diana Thornton      Date of Birth: [birth year] MRN: [id number] PCP: Ralene Ok, MD      Visit Date: 09/15/2021   Universal Protocol:    Date/Time: 09/15/2021  Consent Given By: the patient  Position: PRONE  Additional Comments: Vital signs were monitored before and after the procedure. Patient was prepped and draped in the usual sterile fashion. The correct patient, procedure, and site was verified.   Injection Procedure Details:   Procedure diagnoses: Lumbar radiculopathy [M54.16]    Meds Administered:  Meds ordered this encounter  Medications   dexamethasone (DECADRON) injection 10 mg    Laterality: Bilateral  Location/Site: L3  Needle:5.0 in., 22 ga.  Short bevel or Quincke spinal needle  Needle Placement: Transforaminal  Findings:    -Comments: Excellent flow of contrast along the nerve, nerve root and into the epidural space.  Procedure Details: After squaring off the end-plates to get a true AP view, the C-arm was positioned so that an oblique view of the foramen as noted above was visualized. The target area is just inferior to the "nose of the scotty dog" or sub pedicular. The soft tissues overlying this structure were infiltrated with 2-3 ml. of 1% Lidocaine without Epinephrine.  The spinal needle was inserted toward the target using a "trajectory" view along the fluoroscope beam.  Under AP and lateral visualization, the needle was advanced so it did not puncture dura and was located close the 6 O'Clock position of the pedical in AP tracterory. Biplanar projections were used to confirm position. Aspiration was confirmed to be negative for CSF and/or blood. A 1-2 ml. volume of Isovue-250 was injected and flow of contrast was noted at each level. Radiographs were obtained for documentation purposes.   After attaining the desired flow of  contrast documented above, a 0.5 to 1.0 ml test dose of 0.25% Marcaine was injected into each respective transforaminal space.  The patient was observed for 90 seconds post injection.  After no sensory deficits were reported, and normal lower extremity motor function was noted,   the above injectate was administered so that equal amounts of the injectate were placed at each foramen (level) into the transforaminal epidural space.   Additional Comments:  The patient tolerated the procedure well Dressing: 2 x 2 sterile gauze and Band-Aid    Post-procedure details: Patient was observed during the procedure. Post-procedure instructions were reviewed.  Patient left the clinic in stable condition.

## 2022-02-24 ENCOUNTER — Other Ambulatory Visit: Payer: Self-pay | Admitting: Internal Medicine

## 2022-02-24 DIAGNOSIS — Z1231 Encounter for screening mammogram for malignant neoplasm of breast: Secondary | ICD-10-CM

## 2022-04-15 ENCOUNTER — Ambulatory Visit: Payer: Medicare Other

## 2022-05-27 ENCOUNTER — Ambulatory Visit
Admission: RE | Admit: 2022-05-27 | Discharge: 2022-05-27 | Disposition: A | Discharge status: Home / Self Care | Payer: Medicare Other | Source: Ambulatory Visit | Attending: Internal Medicine | Admitting: Internal Medicine

## 2022-05-27 DIAGNOSIS — Z1231 Encounter for screening mammogram for malignant neoplasm of breast: Secondary | ICD-10-CM

## 2022-09-06 ENCOUNTER — Ambulatory Visit: Payer: Medicare Other | Admitting: Cardiology

## 2022-09-06 ENCOUNTER — Encounter: Payer: Self-pay | Admitting: Cardiology

## 2022-09-06 VITALS — BP 156/84 | HR 79 | Resp 16 | Ht 62.0 in | Wt 175.0 lb

## 2022-09-06 DIAGNOSIS — I10 Essential (primary) hypertension: Secondary | ICD-10-CM

## 2022-09-06 DIAGNOSIS — R9431 Abnormal electrocardiogram [ECG] [EKG]: Secondary | ICD-10-CM | POA: Insufficient documentation

## 2022-09-06 NOTE — Progress Notes (Addendum)
Patient referred by Ralene Ok, MD for abnormal EKG  Subjective:   Diana Thornton, female    DOB: Jan 27, 1946, 77 y.o.   MRN: [id number]   Chief Complaint  Patient presents with   Abnormal ECG   New Patient (Initial Visit)    HPI  77 y.o. African-American female with hypertension, hyperlipidemia, referred for abnormal EKG  Patient is retired, stays active with gardening, church activities etc.  She is to walk more, but walking reduced after she had a fall and a knee injury.  She denies any chest pain, shortness of breath symptoms.  Blood pressure is elevated today.  She states that it is usually elevated at doctors offices, but much lower and better controlled at home.  Past Medical History:  Diagnosis Date   Carpal tunnel syndrome    L hand   GERD (gastroesophageal reflux disease)    resolved after her brother passed away    Goiter    Herpes zoster 2003   HTN (hypertension)    Obesity    Osteoarthritis    Vitamin D deficiency      Past Surgical History:  Procedure Laterality Date   ABDOMINAL HYSTERECTOMY     BREAST REDUCTION SURGERY     BREAST SURGERY     CHOLECYSTECTOMY     DILATION AND CURETTAGE OF UTERUS     JOINT REPLACEMENT     Right hip, replacement x2    REDUCTION MAMMAPLASTY Bilateral    TOTAL ANKLE ARTHROPLASTY Left 02/27/2014   Procedure: LEFT TOTAL ANKLE ARTHOPLASTY;  Surgeon: Toni Arthurs, MD;  Location: [facility] OR;  Service: Orthopedics;  Laterality: Left;   TOTAL HIP ARTHROPLASTY     TUBAL LIGATION       Social History   Tobacco Use  Smoking Status Never  Smokeless Tobacco Not on file    Social History   Substance and Sexual Activity  Alcohol Use No     Family History  Problem Relation Age of Onset   Heart disease Mother    Heart disease Father    Diabetes Sister    Diabetes Sister    Diabetes Sister    Heart disease Brother       Current Outpatient Medications:    acetaminophen (TYLENOL) 325 MG tablet, Take 2 tablets  (650 mg total) by mouth every 6 (six) hours as needed for headache or mild pain., Disp: , Rfl:    aspirin EC 325 MG tablet, Take 1 tablet (325 mg total) by mouth daily., Disp: 42 tablet, Rfl: 0   docusate sodium (COLACE) 100 MG capsule, Take 1 capsule (100 mg total) by mouth 2 (two) times daily. While taking narcotic pain medicine., Disp: 30 capsule, Rfl: 0   gabapentin (NEURONTIN) 100 MG capsule, 1 po bid prn pain, Disp: 30 capsule, Rfl: 0   hydrochlorothiazide (HYDRODIURIL) 50 MG tablet, Take 50 mg by mouth daily.  , Disp: , Rfl:    naproxen sodium (ANAPROX) 220 MG tablet, Take 440-1,760 mg by mouth 2 (two) times daily with a meal., Disp: , Rfl:    oxyCODONE (ROXICODONE) 5 MG immediate release tablet, Take 1-2 tablets (5-10 mg total) by mouth every 4 (four) hours as needed for moderate pain or severe pain., Disp: 30 tablet, Rfl: 0   potassium chloride (K-DUR) 10 MEQ tablet, Take 10 mEq by mouth daily., Disp: , Rfl:    pravastatin (PRAVACHOL) 40 MG tablet, Take 40 mg by mouth daily., Disp: , Rfl:    senna (SENOKOT) 8.6 MG tablet,  Take 2 tablets (17.2 mg total) by mouth daily. While taking narcotic pain medicine., Disp: 60 tablet, Rfl: 0   spironolactone (ALDACTONE) 25 MG tablet, Take 50 mg by mouth daily., Disp: , Rfl:    verapamil (CALAN) 80 MG tablet, Take 80 mg by mouth daily., Disp: , Rfl:    Cardiovascular and other pertinent studies:  Reviewed external labs and tests, independently interpreted  EKG 09/06/2022: Sinus rhythm 78 bpm  Left atrial enlargement. Diffuse ST depression, nonspecific   Recent labs: 08/30/2022: Glucose 94, BUN/Cr 14/0.88. EGFR 68. Na/K 139/4.1. Rest of the CMP normal H/H 13/42. MCV 87. Platelets 293 HbA1C 5.8% Chol 198, TG 112, HDL 71, LDL 107 TSH 3.3 normal    Review of Systems  Cardiovascular:  Negative for chest pain, dyspnea on exertion, leg swelling, palpitations and syncope.         Vitals:   09/06/22 0947 09/06/22 0955  BP: (!) 174/84 (!)  156/84  Pulse: 86 79  Resp: 16   SpO2: 96% 96%     Body mass index is 32.01 kg/m. Filed Weights   09/06/22 0947  Weight: 175 lb (79.4 kg)     Objective:   Physical Exam Vitals and nursing note reviewed.  Constitutional:      General: She is not in acute distress. Neck:     Vascular: No JVD.  Cardiovascular:     Rate and Rhythm: Normal rate and regular rhythm.     Heart sounds: Murmur heard.     Harsh midsystolic murmur is present with a grade of 2/6 at the upper right sternal border radiating to the neck.  Pulmonary:     Effort: Pulmonary effort is normal.     Breath sounds: Normal breath sounds. No wheezing or rales.  Musculoskeletal:     Right lower leg: No edema.     Left lower leg: No edema.        Visit diagnoses:   ICD-10-CM   1. Abnormal EKG  R94.31 EKG 12-Lead    PCV ECHOCARDIOGRAM COMPLETE    CT CARDIAC SCORING (SELF PAY ONLY)    2. Primary hypertension  I10        Orders Placed This Encounter  Procedures   CT CARDIAC SCORING (SELF PAY ONLY)   EKG 12-Lead   PCV ECHOCARDIOGRAM COMPLETE     Assessment & Recommendations:   77 y.o. African-American female with hypertension, hyperlipidemia, referred for abnormal EKG  Abnormal EKG: Diffuse nonspecific ST depression.  I suspect this is most likely related to hypertension.  Will obtain echocardiogram, which would also help assess etiology of her systolic murmur.  Hypertension: Generally better controlled.  No change made today.  Given that she is on spironolactone, I asked her to stop taking care of, she specifically asked about it.  Mixed hyperlipidemia: Currently not on statin therapy.  Recommend CT cardiac scoring for risk stratification.  Further recommendations after above testing.   Thank you for referring the patient to [place]. Please feel free to contact with any questions.   Elder Negus, MD Pager: [phone] Office: [phone]

## 2022-10-06 ENCOUNTER — Ambulatory Visit: Payer: Medicare Other

## 2022-10-06 DIAGNOSIS — R9431 Abnormal electrocardiogram [ECG] [EKG]: Secondary | ICD-10-CM

## 2022-10-07 ENCOUNTER — Ambulatory Visit (HOSPITAL_COMMUNITY)
Admission: RE | Admit: 2022-10-07 | Discharge: 2022-10-07 | Disposition: A | Discharge status: Home / Self Care | Payer: Medicare Other | Source: Ambulatory Visit | Attending: Cardiology | Admitting: Cardiology

## 2022-10-07 DIAGNOSIS — R9431 Abnormal electrocardiogram [ECG] [EKG]: Secondary | ICD-10-CM | POA: Insufficient documentation

## 2022-10-14 NOTE — Progress Notes (Signed)
Pt understands results.

## 2022-10-14 NOTE — Progress Notes (Signed)
Pt understands.

## 2023-04-17 ENCOUNTER — Other Ambulatory Visit: Payer: Self-pay | Admitting: Internal Medicine

## 2023-04-17 DIAGNOSIS — Z1231 Encounter for screening mammogram for malignant neoplasm of breast: Secondary | ICD-10-CM

## 2023-05-29 ENCOUNTER — Ambulatory Visit
Admission: RE | Admit: 2023-05-29 | Discharge: 2023-05-29 | Disposition: A | Discharge status: Home / Self Care | Source: Ambulatory Visit | Attending: Internal Medicine | Admitting: Internal Medicine

## 2023-05-29 DIAGNOSIS — Z1231 Encounter for screening mammogram for malignant neoplasm of breast: Secondary | ICD-10-CM
